# Patient Record
Sex: Male | Born: 1970 | Race: White | Hispanic: No | Smoking: Former smoker
Health system: Southern US, Community
[De-identification: ages and names within clinical notes are randomized; demographics above are authoritative.]

## PROBLEM LIST (undated history)

## (undated) DIAGNOSIS — S3210XA Unspecified fracture of sacrum, initial encounter for closed fracture: Secondary | ICD-10-CM

## (undated) DIAGNOSIS — I82429 Acute embolism and thrombosis of unspecified iliac vein: Secondary | ICD-10-CM

## (undated) DIAGNOSIS — S37012D Minor contusion of left kidney, subsequent encounter: Secondary | ICD-10-CM

## (undated) DIAGNOSIS — S92009A Unspecified fracture of unspecified calcaneus, initial encounter for closed fracture: Secondary | ICD-10-CM

## (undated) DIAGNOSIS — I10 Essential (primary) hypertension: Secondary | ICD-10-CM

## (undated) HISTORY — DX: Acute embolism and thrombosis of unspecified iliac vein: I82.429

## (undated) HISTORY — DX: Minor contusion of left kidney, subsequent encounter: S37.012D

## (undated) HISTORY — DX: Essential (primary) hypertension: I10

## (undated) HISTORY — PX: ELBOW SURGERY: SHX618

---

## 2004-07-03 ENCOUNTER — Ambulatory Visit (HOSPITAL_BASED_OUTPATIENT_CLINIC_OR_DEPARTMENT_OTHER): Admission: RE | Admit: 2004-07-03 | Discharge: 2004-07-03 | Payer: Self-pay | Admitting: Orthopedic Surgery

## 2006-04-13 ENCOUNTER — Emergency Department: Payer: Self-pay | Admitting: Unknown Physician Specialty

## 2009-01-22 ENCOUNTER — Emergency Department (HOSPITAL_COMMUNITY): Admission: EM | Admit: 2009-01-22 | Discharge: 2009-01-22 | Payer: Self-pay | Admitting: Emergency Medicine

## 2009-05-23 ENCOUNTER — Emergency Department (HOSPITAL_COMMUNITY): Admission: EM | Admit: 2009-05-23 | Discharge: 2009-05-24 | Payer: Self-pay | Admitting: Emergency Medicine

## 2010-12-12 LAB — DIFFERENTIAL
Basophils Absolute: 0 10*3/uL (ref 0.0–0.1)
Basophils Relative: 0 % (ref 0–1)
Eosinophils Absolute: 0.2 10*3/uL (ref 0.0–0.7)
Lymphs Abs: 2.4 10*3/uL (ref 0.7–4.0)
Neutrophils Relative %: 49 % (ref 43–77)

## 2010-12-12 LAB — BASIC METABOLIC PANEL
BUN: 6 mg/dL (ref 6–23)
Chloride: 109 mEq/L (ref 96–112)
Creatinine, Ser: 0.74 mg/dL (ref 0.4–1.5)

## 2010-12-12 LAB — ETHANOL
Alcohol, Ethyl (B): 185 mg/dL — ABNORMAL HIGH (ref 0–10)
Alcohol, Ethyl (B): 241 mg/dL — ABNORMAL HIGH (ref 0–10)
Alcohol, Ethyl (B): 311 mg/dL — ABNORMAL HIGH (ref 0–10)

## 2010-12-12 LAB — CBC
MCV: 98.3 fL (ref 78.0–100.0)
Platelets: 212 10*3/uL (ref 150–400)
WBC: 6 10*3/uL (ref 4.0–10.5)

## 2010-12-12 LAB — RAPID URINE DRUG SCREEN, HOSP PERFORMED: Cocaine: NOT DETECTED

## 2010-12-16 LAB — RAPID URINE DRUG SCREEN, HOSP PERFORMED
Amphetamines: NOT DETECTED
Barbiturates: NOT DETECTED
Benzodiazepines: NOT DETECTED
Cocaine: NOT DETECTED
Opiates: NOT DETECTED

## 2010-12-16 LAB — POCT I-STAT, CHEM 8
Calcium, Ion: 1.12 mmol/L (ref 1.12–1.32)
Creatinine, Ser: 1 mg/dL (ref 0.4–1.5)
Glucose, Bld: 74 mg/dL (ref 70–99)
Hemoglobin: 15.6 g/dL (ref 13.0–17.0)
TCO2: 27 mmol/L (ref 0–100)
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
## 2014-04-06 ENCOUNTER — Emergency Department (INDEPENDENT_AMBULATORY_CARE_PROVIDER_SITE_OTHER)
Admission: EM | Admit: 2014-04-06 | Discharge: 2014-04-06 | Disposition: A | Discharge status: Home / Self Care | Payer: Self-pay | Source: Home / Self Care | Attending: Family Medicine | Admitting: Family Medicine

## 2014-04-06 ENCOUNTER — Encounter (HOSPITAL_COMMUNITY): Payer: Self-pay | Admitting: Emergency Medicine

## 2014-04-06 DIAGNOSIS — IMO0002 Reserved for concepts with insufficient information to code with codable children: Secondary | ICD-10-CM

## 2014-04-06 DIAGNOSIS — S60519A Abrasion of unspecified hand, initial encounter: Secondary | ICD-10-CM

## 2014-04-06 MED ORDER — SILVER SULFADIAZINE 1 % EX CREA
TOPICAL_CREAM | Freq: Once | CUTANEOUS | Status: AC
  Administered 2014-04-06: 10:00:00 via TOPICAL
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
## 2014-04-06 NOTE — Discharge Instructions (Signed)
Thank you for coming in today. Keep the wounds clean and covered with Vaseline. Return to clinic early next week for wound recheck. Come back as needed   Abrasion An abrasion is a cut or scrape of the skin. Abrasions do not extend through all layers of the skin and most heal within 10 days. It is important to care for your abrasion properly to prevent infection. CAUSES  Most abrasions are caused by falling on, or gliding across, the ground or other surface. When your skin rubs on something, the outer and inner layer of skin rubs off, causing an abrasion. DIAGNOSIS  Your caregiver will be able to diagnose an abrasion during a physical exam.  TREATMENT  Your treatment depends on how large and deep the abrasion is. Generally, your abrasion will be cleaned with water and a mild soap to remove any dirt or debris. An antibiotic ointment may be put over the abrasion to prevent an infection. A bandage (dressing) may be wrapped around the abrasion to keep it from getting dirty.  You may need a tetanus shot if:  You cannot remember when you had your last tetanus shot.  You have never had a tetanus shot.  The injury broke your skin. If you get a tetanus shot, your arm may swell, get red, and feel warm to the touch. This is common and not a problem. If you need a tetanus shot and you choose not to have one, there is a rare chance of getting tetanus. Sickness from tetanus can be serious.  HOME CARE INSTRUCTIONS   If a dressing was applied, change it at least once a day or as directed by your caregiver. If the bandage sticks, soak it off with warm water.   Wash the area with water and a mild soap to remove all the ointment 2 times a day. Rinse off the soap and pat the area dry with a clean towel.   Reapply any ointment as directed by your caregiver. This will help prevent infection and keep the bandage from sticking. Use gauze over the wound and under the dressing to help keep the bandage from  sticking.   Change your dressing right away if it becomes wet or dirty.   Only take over-the-counter or prescription medicines for pain, discomfort, or fever as directed by your caregiver.   Follow up with your caregiver within 24-48 hours for a wound check, or as directed. If you were not given a wound-check appointment, look closely at your abrasion for redness, swelling, or pus. These are signs of infection. SEEK IMMEDIATE MEDICAL CARE IF:   You have increasing pain in the wound.   You have redness, swelling, or tenderness around the wound.   You have pus coming from the wound.   You have a fever or persistent symptoms for more than 2-3 days.  You have a fever and your symptoms suddenly get worse.  You have a bad smell coming from the wound or dressing.  MAKE SURE YOU:   Understand these instructions.  Will watch your condition.  Will get help right away if you are not doing well or get worse. Document Released: 06/03/2005 Document Revised: 08/10/2012 Document Reviewed: 07/28/2011 [facility] Patient Information 2015 [employer] [place]. This information is not intended to replace advice given to you by your health care provider. Make sure you discuss any questions you have with your health care provider.
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
## 2019-06-29 ENCOUNTER — Encounter (INDEPENDENT_AMBULATORY_CARE_PROVIDER_SITE_OTHER): Payer: Self-pay | Admitting: Primary Care

## 2019-06-29 ENCOUNTER — Ambulatory Visit (INDEPENDENT_AMBULATORY_CARE_PROVIDER_SITE_OTHER): Payer: Managed Care, Other (non HMO) | Admitting: Primary Care

## 2019-06-29 ENCOUNTER — Other Ambulatory Visit: Payer: Self-pay

## 2019-06-29 VITALS — BP 127/86 | HR 70 | Temp 97.3°F | Ht 71.0 in | Wt 210.4 lb

## 2019-06-29 DIAGNOSIS — R519 Headache, unspecified: Secondary | ICD-10-CM | POA: Diagnosis not present

## 2019-06-29 DIAGNOSIS — Z6829 Body mass index (BMI) 29.0-29.9, adult: Secondary | ICD-10-CM

## 2019-06-29 DIAGNOSIS — Z114 Encounter for screening for human immunodeficiency virus [HIV]: Secondary | ICD-10-CM | POA: Diagnosis not present

## 2019-06-29 DIAGNOSIS — Z7689 Persons encountering health services in other specified circumstances: Secondary | ICD-10-CM

## 2019-06-29 MED ORDER — IBUPROFEN 600 MG PO TABS: 600.0000 mg | ORAL_TABLET | Freq: Three times a day (TID) | ORAL | 1 refills | Status: DC | PRN

## 2019-06-29 NOTE — Progress Notes (Signed)
Re-occurring tension headache in back of head

## 2019-06-29 NOTE — Progress Notes (Signed)
New Patient Office Visit  Subjective:  Patient ID: Troy Klein, male    DOB: Nov 10, 1970  Age: 48 y.o. MRN: [id number]  CC:  Chief Complaint  Patient presents with  . New Patient (Initial Visit)    HPI Troy Klein presents for establishment of care. Last seen PCP approxmately  25 years ago. He is here today because physical and frequent headaches. He has stopped drinking heavy  smoking marijuana daily and cigarettes stopped all this behavior and the headaches started. This behavior has been stopped 3 months.  History reviewed. No pertinent past medical history.  History reviewed. No pertinent surgical history.  History reviewed. No pertinent family history.  Social History   Socioeconomic History  . Marital status: Divorced    Spouse name: Not on file  . Number of children: Not on file  . Years of education: Not on file  . Highest education level: Not on file  Occupational History  . Not on file  Social Needs  . Financial resource strain: Not on file  . Food insecurity    Worry: Not on file    Inability: Not on file  . Transportation needs    Medical: Not on file    Non-medical: Not on file  Tobacco Use  . Smoking status: Former Smoker    Packs/day: 1.00    Types: Cigarettes  . Smokeless tobacco: Never Used  Substance and Sexual Activity  . Alcohol use: Not Currently    Comment: quit 3 months ago   . Drug use: No  . Sexual activity: Not Currently  Lifestyle  . Physical activity    Days per week: Not on file    Minutes per session: Not on file  . Stress: Not on file  Relationships  . Social Herbalist on phone: Not on file    Gets together: Not on file    Attends religious service: Not on file    Active member of club or organization: Not on file    Attends meetings of clubs or organizations: Not on file    Relationship status: Not on file  . Intimate partner violence    Fear of current or ex partner: Not on file    Emotionally abused: Not on  file    Physically abused: Not on file    Forced sexual activity: Not on file  Other Topics Concern  . Not on file  Social History Narrative  . Not on file    ROS Review of Systems  Neurological: Positive for headaches.  All other systems reviewed and are negative.   Objective:   Today's Vitals: BP 127/86 (BP Location: Left Arm, Patient Position: Sitting, Cuff Size: Normal)   Pulse 70   Temp (!) 97.3 F (36.3 C) (Temporal)   Ht _0  (1.803 m)   Wt 210 lb 6.4 oz (95.4 kg)   SpO2 97%   BMI 29.34 kg/m   Physical Exam Vitals signs reviewed.  Constitutional:      Appearance: Normal appearance.  HENT:     Head: Normocephalic.     Right Ear: Tympanic membrane normal.     Left Ear: Tympanic membrane normal.  Eyes:     Extraocular Movements: Extraocular movements intact.     Pupils: Pupils are equal, round, and reactive to light.  Neck:     Musculoskeletal: Normal range of motion and neck supple.  Cardiovascular:     Rate and Rhythm: Normal rate and regular rhythm.  Pulmonary:     Effort: Pulmonary effort is normal.     Breath sounds: Normal breath sounds.  Abdominal:     General: Abdomen is flat. Bowel sounds are normal.     Palpations: Abdomen is soft.  Musculoskeletal: Normal range of motion.  Skin:    General: Skin is warm and dry.  Neurological:     Mental Status: He is alert and oriented to person, place, and time.  Psychiatric:        Mood and Affect: Mood normal.     Assessment & Plan:  Loyalty was seen today for new patient (initial visit).  Diagnoses and all orders for this visit:  Encounter to establish care Troy Mire, NP-C will be your  (PCP) that will  provides knowledge of diagnosed and  undiagnosed health concern as well as continuing care of varied medical conditions, not limited by cause, organ system, or diagnosis. Patient verbalized understanding -     CBC with Differential -     CMP14+EGFR -     Lipid panel  Screening for HIV  (human immunodeficiency virus) -     HIV Antibody (routine testing w rflx)  Body mass index (BMI) 29.0-29.9, adult Discussed BMI indicates over weight he has notice eating more sweets since he stopped previous substance use. Now he is jobbing a mile a day. Also, discussed healthy diet.  Nonintractable headache, unspecified chronicity pattern, unspecified headache type Unknown triggers discussed going cold [place] on cigarettes, marijuana and  Tobacco abuse. Treatment ibuprofen and headache log.  Other orders -     ibuprofen (ADVIL) 600 MG tablet; Take 1 tablet (600 mg total) by mouth every 8 (eight) hours as needed for headache.    Outpatient Encounter Medications as of 06/29/2019  Medication Sig  . ibuprofen (ADVIL) 600 MG tablet Take 1 tablet (600 mg total) by mouth every 8 (eight) hours as needed for headache.   No facility-administered encounter medications on file as of 06/29/2019.     Follow-up: Return if symptoms worsen or fail to improve.   [name], NP

## 2019-06-29 NOTE — Patient Instructions (Signed)

## 2019-06-30 ENCOUNTER — Telehealth (INDEPENDENT_AMBULATORY_CARE_PROVIDER_SITE_OTHER): Payer: Self-pay

## 2019-06-30 LAB — LIPID PANEL
Chol/HDL Ratio: 3 ratio (ref 0.0–5.0)
Cholesterol, Total: 157 mg/dL (ref 100–199)
HDL: 52 mg/dL (ref >39)
LDL Chol Calc (NIH): 90 mg/dL (ref 0–99)
Triglycerides: 80 mg/dL (ref 0–149)
VLDL Cholesterol Cal: 15 mg/dL (ref 5–40)

## 2019-06-30 LAB — CBC WITH DIFFERENTIAL/PLATELET
Basophils Absolute: 0 10*3/uL (ref 0.0–0.2)
Basos: 1 %
EOS (ABSOLUTE): 0.2 10*3/uL (ref 0.0–0.4)
Eos: 5 %
Hematocrit: 37.2 % — ABNORMAL LOW (ref 37.5–51.0)
Hemoglobin: 13.3 g/dL (ref 13.0–17.7)
Immature Grans (Abs): 0 10*3/uL (ref 0.0–0.1)
Immature Granulocytes: 0 %
Lymphocytes Absolute: 1.5 10*3/uL (ref 0.7–3.1)
Lymphs: 31 %
MCH: 33.1 pg — ABNORMAL HIGH (ref 26.6–33.0)
MCHC: 35.8 g/dL — ABNORMAL HIGH (ref 31.5–35.7)
MCV: 93 fL (ref 79–97)
Monocytes Absolute: 0.5 10*3/uL (ref 0.1–0.9)
Monocytes: 10 %
Neutrophils Absolute: 2.6 10*3/uL (ref 1.4–7.0)
Neutrophils: 53 %
Platelets: 243 10*3/uL (ref 150–450)
RBC: 4.02 x10E6/uL — ABNORMAL LOW (ref 4.14–5.80)
RDW: 11.6 % (ref 11.6–15.4)
WBC: 4.9 10*3/uL (ref 3.4–10.8)

## 2019-06-30 LAB — CMP14+EGFR
ALT: 30 IU/L (ref 0–44)
AST: 24 IU/L (ref 0–40)
Albumin/Globulin Ratio: 2.1 (ref 1.2–2.2)
Albumin: 4.4 g/dL (ref 4.0–5.0)
Alkaline Phosphatase: 66 IU/L (ref 39–117)
BUN/Creatinine Ratio: 16 (ref 9–20)
BUN: 12 mg/dL (ref 6–24)
Bilirubin Total: 0.5 mg/dL (ref 0.0–1.2)
CO2: 22 mmol/L (ref 20–29)
Calcium: 9.5 mg/dL (ref 8.7–10.2)
Chloride: 108 mmol/L — ABNORMAL HIGH (ref 96–106)
Creatinine, Ser: 0.77 mg/dL (ref 0.76–1.27)
GFR calc Af Amer: 123 mL/min/{1.73_m2} (ref >59)
GFR calc non Af Amer: 107 mL/min/{1.73_m2} (ref >59)
Globulin, Total: 2.1 g/dL (ref 1.5–4.5)
Glucose: 97 mg/dL (ref 65–99)
Potassium: 4.2 mmol/L (ref 3.5–5.2)
Sodium: 142 mmol/L (ref 134–144)
Total Protein: 6.5 g/dL (ref 6.0–8.5)

## 2019-06-30 LAB — HIV ANTIBODY (ROUTINE TESTING W REFLEX): HIV Screen 4th Generation wRfx: NONREACTIVE

## 2019-06-30 NOTE — Telephone Encounter (Signed)
Patient is aware that labs are normal. Tempestt S Roberts, CMA  

## 2019-06-30 NOTE — Telephone Encounter (Signed)
-----   Message from [name], NP sent at 06/30/2019  9:02 AM EDT ----- Labs I have reviewed all labs and they are normal with some variation no need for concern will monitor

## 2019-07-24 ENCOUNTER — Other Ambulatory Visit: Payer: Self-pay | Admitting: *Deleted

## 2019-07-24 ENCOUNTER — Telehealth (INDEPENDENT_AMBULATORY_CARE_PROVIDER_SITE_OTHER): Payer: Self-pay

## 2019-07-24 DIAGNOSIS — Z20822 Contact with and (suspected) exposure to covid-19: Secondary | ICD-10-CM

## 2019-07-24 NOTE — Telephone Encounter (Signed)
Patient called to inform that his insurance is not covering his office visit bill do to the wrong code that was used. Patient will like to receive a call to get more information from PCP. Patient works second shift is available around 9 am to 2:30 pm.  Please advice [phone]   Thank you Whitney Post

## 2019-07-24 NOTE — Telephone Encounter (Signed)
FWD to PCP. Aamilah Augenstein S Marisol Glazer, CMA  

## 2019-07-26 LAB — NOVEL CORONAVIRUS, NAA: SARS-CoV-2, NAA: NOT DETECTED

## 2019-07-28 NOTE — Telephone Encounter (Signed)
Called Mr. Swenson and billing for clarification. Saw patient to establish care but if not coded as physical his insurance is denying to pay the bill. Provided billing with patient information and number.

## 2019-08-08 ENCOUNTER — Other Ambulatory Visit (INDEPENDENT_AMBULATORY_CARE_PROVIDER_SITE_OTHER): Payer: Self-pay | Admitting: Primary Care

## 2019-08-08 NOTE — Telephone Encounter (Signed)
FWD to PCP. Shivangi Lutz S Londan Coplen, CMA  

## 2019-09-24 ENCOUNTER — Other Ambulatory Visit (INDEPENDENT_AMBULATORY_CARE_PROVIDER_SITE_OTHER): Payer: Self-pay | Admitting: Primary Care

## 2019-09-25 NOTE — Telephone Encounter (Signed)
FWD to PCP

## 2020-03-24 ENCOUNTER — Emergency Department (HOSPITAL_COMMUNITY): Payer: Managed Care, Other (non HMO)

## 2020-03-24 ENCOUNTER — Inpatient Hospital Stay (HOSPITAL_COMMUNITY)
Admission: EM | Admit: 2020-03-24 | Discharge: 2020-03-28 | DRG: 964 | Disposition: A | Discharge status: Home Health | Payer: Managed Care, Other (non HMO) | Attending: General Surgery | Admitting: General Surgery

## 2020-03-24 ENCOUNTER — Encounter (HOSPITAL_COMMUNITY): Payer: Self-pay

## 2020-03-24 DIAGNOSIS — Z791 Long term (current) use of non-steroidal anti-inflammatories (NSAID): Secondary | ICD-10-CM

## 2020-03-24 DIAGNOSIS — S92001A Unspecified fracture of right calcaneus, initial encounter for closed fracture: Secondary | ICD-10-CM | POA: Diagnosis present

## 2020-03-24 DIAGNOSIS — S270XXA Traumatic pneumothorax, initial encounter: Principal | ICD-10-CM | POA: Diagnosis present

## 2020-03-24 DIAGNOSIS — S37032A Laceration of left kidney, unspecified degree, initial encounter: Secondary | ICD-10-CM

## 2020-03-24 DIAGNOSIS — S27321A Contusion of lung, unilateral, initial encounter: Secondary | ICD-10-CM | POA: Diagnosis present

## 2020-03-24 DIAGNOSIS — Z20822 Contact with and (suspected) exposure to covid-19: Secondary | ICD-10-CM | POA: Diagnosis present

## 2020-03-24 DIAGNOSIS — F17210 Nicotine dependence, cigarettes, uncomplicated: Secondary | ICD-10-CM | POA: Diagnosis present

## 2020-03-24 DIAGNOSIS — Y92410 Unspecified street and highway as the place of occurrence of the external cause: Secondary | ICD-10-CM

## 2020-03-24 DIAGNOSIS — R0602 Shortness of breath: Secondary | ICD-10-CM | POA: Diagnosis present

## 2020-03-24 DIAGNOSIS — S37012A Minor contusion of left kidney, initial encounter: Secondary | ICD-10-CM | POA: Diagnosis present

## 2020-03-24 DIAGNOSIS — J939 Pneumothorax, unspecified: Secondary | ICD-10-CM

## 2020-03-24 DIAGNOSIS — I959 Hypotension, unspecified: Secondary | ICD-10-CM | POA: Diagnosis present

## 2020-03-24 DIAGNOSIS — S32111A Minimally displaced Zone I fracture of sacrum, initial encounter for closed fracture: Secondary | ICD-10-CM | POA: Diagnosis present

## 2020-03-24 DIAGNOSIS — M5126 Other intervertebral disc displacement, lumbar region: Secondary | ICD-10-CM | POA: Diagnosis present

## 2020-03-24 DIAGNOSIS — S2231XA Fracture of one rib, right side, initial encounter for closed fracture: Secondary | ICD-10-CM | POA: Diagnosis present

## 2020-03-24 LAB — LACTIC ACID, PLASMA: Lactic Acid, Venous: 2.7 mmol/L (ref 0.5–1.9)

## 2020-03-24 LAB — I-STAT CHEM 8, ED
BUN: 9 mg/dL (ref 6–20)
Calcium, Ion: 1.12 mmol/L — ABNORMAL LOW (ref 1.15–1.40)
Chloride: 104 mmol/L (ref 98–111)
Creatinine, Ser: 1 mg/dL (ref 0.61–1.24)
Glucose, Bld: 94 mg/dL (ref 70–99)
HCT: 42 % (ref 39.0–52.0)
Hemoglobin: 14.3 g/dL (ref 13.0–17.0)
Potassium: 3.3 mmol/L — ABNORMAL LOW (ref 3.5–5.1)
Sodium: 141 mmol/L (ref 135–145)
TCO2: 22 mmol/L (ref 22–32)

## 2020-03-24 LAB — COMPREHENSIVE METABOLIC PANEL
ALT: 28 U/L (ref 0–44)
AST: 33 U/L (ref 15–41)
Albumin: 3.7 g/dL (ref 3.5–5.0)
Alkaline Phosphatase: 56 U/L (ref 38–126)
Anion gap: 10 (ref 5–15)
BUN: 9 mg/dL (ref 6–20)
CO2: 23 mmol/L (ref 22–32)
Calcium: 8.8 mg/dL — ABNORMAL LOW (ref 8.9–10.3)
Chloride: 106 mmol/L (ref 98–111)
Creatinine, Ser: 0.89 mg/dL (ref 0.61–1.24)
GFR calc Af Amer: >60 mL/min (ref >60)
GFR calc non Af Amer: >60 mL/min (ref >60)
Glucose, Bld: 103 mg/dL — ABNORMAL HIGH (ref 70–99)
Potassium: 3.4 mmol/L — ABNORMAL LOW (ref 3.5–5.1)
Sodium: 139 mmol/L (ref 135–145)
Total Bilirubin: 0.7 mg/dL (ref 0.3–1.2)
Total Protein: 6.4 g/dL — ABNORMAL LOW (ref 6.5–8.1)

## 2020-03-24 LAB — PROTIME-INR
INR: 1 (ref 0.8–1.2)
Prothrombin Time: 12.5 seconds (ref 11.4–15.2)

## 2020-03-24 LAB — TROPONIN I (HIGH SENSITIVITY): Troponin I (High Sensitivity): 8 ng/L (ref <18)

## 2020-03-24 LAB — CBC
HCT: 41.9 % (ref 39.0–52.0)
Hemoglobin: 14.9 g/dL (ref 13.0–17.0)
MCH: 34.1 pg — ABNORMAL HIGH (ref 26.0–34.0)
MCHC: 35.6 g/dL (ref 30.0–36.0)
MCV: 95.9 fL (ref 80.0–100.0)
Platelets: 199 10*3/uL (ref 150–400)
RBC: 4.37 MIL/uL (ref 4.22–5.81)
RDW: 12.4 % (ref 11.5–15.5)
WBC: 8.9 10*3/uL (ref 4.0–10.5)
nRBC: 0 % (ref 0.0–0.2)

## 2020-03-24 LAB — ETHANOL: Alcohol, Ethyl (B): 231 mg/dL — ABNORMAL HIGH (ref <10)

## 2020-03-24 LAB — SAMPLE TO BLOOD BANK

## 2020-03-24 MED ORDER — FENTANYL CITRATE (PF) 100 MCG/2ML IJ SOLN
25.0000 ug | Freq: Once | INTRAMUSCULAR | Status: AC
  Administered 2020-03-24: 25 ug via INTRAVENOUS
  Filled 2020-03-24: qty 2

## 2020-03-24 MED ORDER — SODIUM CHLORIDE 0.9 % IV BOLUS
1000.0000 mL | Freq: Once | INTRAVENOUS | Status: AC
  Administered 2020-03-24: 1000 mL via INTRAVENOUS

## 2020-03-24 MED ORDER — IOHEXOL 300 MG/ML  SOLN
100.0000 mL | Freq: Once | INTRAMUSCULAR | Status: AC | PRN
  Administered 2020-03-24: 100 mL via INTRAVENOUS

## 2020-03-24 NOTE — ED Notes (Signed)
Placed on 2L ?
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
## 2020-03-24 NOTE — Progress Notes (Signed)
Orthopedic Tech Progress Note Patient Details:  Troy Klein 23-Mar-1971 [id number] Level 2 Trauma  Patient ID: [name], male   DOB: [birth year], 48 y.o.   MRN: [id number]   [name] 03/24/2020, 9:50 PM
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
## 2020-03-24 NOTE — Progress Notes (Signed)
Orthopedic Tech Progress Note Patient Details:  Troy Klein 12/05/1970 [id number]  Ortho Devices Type of Ortho Device: Post (short leg) splint, Stirrup splint Ortho Device/Splint Location: RLE Ortho Device/Splint Interventions: Application   Post Interventions Patient Tolerated: Well Instructions Provided: Care of device   Hulda Reddix E Janasha Barkalow 03/24/2020, 11:09 PM

## 2020-03-25 ENCOUNTER — Inpatient Hospital Stay (HOSPITAL_COMMUNITY): Payer: Managed Care, Other (non HMO)

## 2020-03-25 LAB — CBC
HCT: 39.8 % (ref 39.0–52.0)
Hemoglobin: 14 g/dL (ref 13.0–17.0)
MCH: 34.1 pg — ABNORMAL HIGH (ref 26.0–34.0)
MCHC: 35.2 g/dL (ref 30.0–36.0)
MCV: 96.8 fL (ref 80.0–100.0)
Platelets: 173 10*3/uL (ref 150–400)
RBC: 4.11 MIL/uL — ABNORMAL LOW (ref 4.22–5.81)
RDW: 12.7 % (ref 11.5–15.5)
WBC: 8.2 10*3/uL (ref 4.0–10.5)
nRBC: 0 % (ref 0.0–0.2)

## 2020-03-25 LAB — URINALYSIS, ROUTINE W REFLEX MICROSCOPIC
Bilirubin Urine: NEGATIVE
Glucose, UA: NEGATIVE mg/dL
Ketones, ur: NEGATIVE mg/dL
Leukocytes,Ua: NEGATIVE
Nitrite: NEGATIVE
Protein, ur: NEGATIVE mg/dL
Specific Gravity, Urine: 1.026 (ref 1.005–1.030)
pH: 6 (ref 5.0–8.0)

## 2020-03-25 LAB — BASIC METABOLIC PANEL
Anion gap: 11 (ref 5–15)
BUN: 7 mg/dL (ref 6–20)
CO2: 19 mmol/L — ABNORMAL LOW (ref 22–32)
Calcium: 8.6 mg/dL — ABNORMAL LOW (ref 8.9–10.3)
Chloride: 109 mmol/L (ref 98–111)
Creatinine, Ser: 0.79 mg/dL (ref 0.61–1.24)
GFR calc Af Amer: >60 mL/min (ref >60)
GFR calc non Af Amer: >60 mL/min (ref >60)
Glucose, Bld: 102 mg/dL — ABNORMAL HIGH (ref 70–99)
Potassium: 3.8 mmol/L (ref 3.5–5.1)
Sodium: 139 mmol/L (ref 135–145)

## 2020-03-25 LAB — TROPONIN I (HIGH SENSITIVITY): Troponin I (High Sensitivity): 5 ng/L (ref <18)

## 2020-03-25 LAB — SARS CORONAVIRUS 2 BY RT PCR (HOSPITAL ORDER, PERFORMED IN ~~LOC~~ HOSPITAL LAB): SARS Coronavirus 2: NEGATIVE

## 2020-03-25 LAB — MRSA PCR SCREENING: MRSA by PCR: NEGATIVE

## 2020-03-25 MED ORDER — POTASSIUM CHLORIDE IN NACL 20-0.9 MEQ/L-% IV SOLN
INTRAVENOUS | Status: DC
  Filled 2020-03-25 (×4): qty 1000

## 2020-03-25 MED ORDER — LORAZEPAM 2 MG/ML IJ SOLN: 1.0000 mg | INTRAMUSCULAR | Status: DC | PRN

## 2020-03-25 MED ORDER — THIAMINE HCL 100 MG PO TABS
100.0000 mg | ORAL_TABLET | Freq: Every day | ORAL | Status: DC
  Administered 2020-03-25 – 2020-03-28 (×5): 100 mg via ORAL
  Filled 2020-03-25 (×5): qty 1

## 2020-03-25 MED ORDER — ENOXAPARIN SODIUM 30 MG/0.3ML ~~LOC~~ SOLN
30.0000 mg | Freq: Two times a day (BID) | SUBCUTANEOUS | Status: DC
  Administered 2020-03-25 – 2020-03-28 (×7): 30 mg via SUBCUTANEOUS
  Filled 2020-03-25 (×7): qty 0.3

## 2020-03-25 MED ORDER — DOCUSATE SODIUM 100 MG PO CAPS
100.0000 mg | ORAL_CAPSULE | Freq: Two times a day (BID) | ORAL | Status: DC
  Administered 2020-03-25 – 2020-03-28 (×8): 100 mg via ORAL
  Filled 2020-03-25 (×8): qty 1

## 2020-03-25 MED ORDER — KETOROLAC TROMETHAMINE 30 MG/ML IJ SOLN
30.0000 mg | Freq: Four times a day (QID) | INTRAMUSCULAR | Status: DC | PRN
  Administered 2020-03-25: 30 mg via INTRAVENOUS
  Filled 2020-03-25: qty 1

## 2020-03-25 MED ORDER — ACETAMINOPHEN 500 MG PO TABS
1000.0000 mg | ORAL_TABLET | Freq: Three times a day (TID) | ORAL | Status: DC
  Administered 2020-03-25 – 2020-03-28 (×12): 1000 mg via ORAL
  Filled 2020-03-25 (×12): qty 2

## 2020-03-25 MED ORDER — FOLIC ACID 1 MG PO TABS
1.0000 mg | ORAL_TABLET | Freq: Every day | ORAL | Status: DC
  Administered 2020-03-26 – 2020-03-28 (×3): 1 mg via ORAL
  Filled 2020-03-25 (×3): qty 1

## 2020-03-25 MED ORDER — POLYETHYLENE GLYCOL 3350 17 G PO PACK: 17.0000 g | PACK | Freq: Every day | ORAL | Status: DC | PRN

## 2020-03-25 MED ORDER — METHOCARBAMOL 1000 MG/10ML IJ SOLN
500.0000 mg | Freq: Three times a day (TID) | INTRAVENOUS | Status: DC
  Administered 2020-03-25 (×2): 500 mg via INTRAVENOUS
  Filled 2020-03-25 (×3): qty 5

## 2020-03-25 MED ORDER — THIAMINE HCL 100 MG/ML IJ SOLN: 100.0000 mg | Freq: Every day | INTRAMUSCULAR | Status: DC

## 2020-03-25 MED ORDER — PANTOPRAZOLE SODIUM 40 MG PO TBEC
40.0000 mg | DELAYED_RELEASE_TABLET | Freq: Every day | ORAL | Status: DC
  Administered 2020-03-25 – 2020-03-28 (×4): 40 mg via ORAL
  Filled 2020-03-25 (×4): qty 1

## 2020-03-25 MED ORDER — ONDANSETRON HCL 4 MG/2ML IJ SOLN: 4.0000 mg | Freq: Four times a day (QID) | INTRAMUSCULAR | Status: DC | PRN

## 2020-03-25 MED ORDER — OXYCODONE HCL 5 MG PO TABS
5.0000 mg | ORAL_TABLET | ORAL | Status: DC | PRN
  Filled 2020-03-25 (×2): qty 1

## 2020-03-25 MED ORDER — ADULT MULTIVITAMIN W/MINERALS CH
1.0000 | ORAL_TABLET | Freq: Every day | ORAL | Status: DC
  Administered 2020-03-25 – 2020-03-28 (×4): 1 via ORAL
  Filled 2020-03-25 (×4): qty 1

## 2020-03-25 MED ORDER — ONDANSETRON 4 MG PO TBDP: 4.0000 mg | ORAL_TABLET | Freq: Four times a day (QID) | ORAL | Status: DC | PRN

## 2020-03-25 MED ORDER — OXYCODONE HCL 5 MG PO TABS
10.0000 mg | ORAL_TABLET | ORAL | Status: DC | PRN
  Administered 2020-03-25 – 2020-03-28 (×12): 10 mg via ORAL
  Filled 2020-03-25 (×12): qty 2

## 2020-03-25 MED ORDER — METHOCARBAMOL 1000 MG/10ML IJ SOLN
1000.0000 mg | Freq: Three times a day (TID) | INTRAVENOUS | Status: DC
  Administered 2020-03-25 (×2): 1000 mg via INTRAVENOUS
  Filled 2020-03-25 (×4): qty 10

## 2020-03-25 MED ORDER — HYDROMORPHONE HCL 1 MG/ML IJ SOLN
1.0000 mg | INTRAMUSCULAR | Status: DC | PRN
  Administered 2020-03-25 – 2020-03-26 (×6): 1 mg via INTRAVENOUS
  Filled 2020-03-25 (×6): qty 1

## 2020-03-25 MED ORDER — GABAPENTIN 300 MG PO CAPS
300.0000 mg | ORAL_CAPSULE | Freq: Three times a day (TID) | ORAL | Status: DC
  Administered 2020-03-25 – 2020-03-28 (×10): 300 mg via ORAL
  Filled 2020-03-25 (×10): qty 1

## 2020-03-25 MED ORDER — LORAZEPAM 1 MG PO TABS
1.0000 mg | ORAL_TABLET | ORAL | Status: DC | PRN
  Administered 2020-03-25: 1 mg via ORAL
  Filled 2020-03-25: qty 1

## 2020-03-25 MED ORDER — MORPHINE SULFATE (PF) 2 MG/ML IV SOLN
1.0000 mg | INTRAVENOUS | Status: DC | PRN
  Filled 2020-03-25: qty 1

## 2020-03-25 MED ORDER — PANTOPRAZOLE SODIUM 40 MG IV SOLR: 40.0000 mg | Freq: Every day | INTRAVENOUS | Status: DC

## 2020-03-25 MED ORDER — MORPHINE SULFATE (PF) 2 MG/ML IV SOLN
2.0000 mg | Freq: Once | INTRAVENOUS | Status: AC
  Administered 2020-03-25: 2 mg via INTRAVENOUS

## 2020-03-25 MED ORDER — BISACODYL 10 MG RE SUPP: 10.0000 mg | Freq: Every day | RECTAL | Status: DC | PRN

## 2020-03-25 NOTE — ED Notes (Signed)
Lunch Trays Ordered @ 1101. 
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
## 2020-03-25 NOTE — ED Notes (Signed)
C/o severe throbbing in foot/heel area,

## 2020-03-25 NOTE — Progress Notes (Signed)
RT note: Instructed patient with use of Incentive Spirometer using the teach back method. Patient displayed proper use and understanding. 

## 2020-03-26 ENCOUNTER — Other Ambulatory Visit: Payer: Self-pay

## 2020-03-26 DIAGNOSIS — S92009A Unspecified fracture of unspecified calcaneus, initial encounter for closed fracture: Secondary | ICD-10-CM | POA: Insufficient documentation

## 2020-03-26 LAB — BASIC METABOLIC PANEL
Anion gap: 7 (ref 5–15)
BUN: 9 mg/dL (ref 6–20)
CO2: 23 mmol/L (ref 22–32)
Calcium: 8.4 mg/dL — ABNORMAL LOW (ref 8.9–10.3)
Chloride: 105 mmol/L (ref 98–111)
Creatinine, Ser: 0.81 mg/dL (ref 0.61–1.24)
GFR calc Af Amer: >60 mL/min (ref >60)
GFR calc non Af Amer: >60 mL/min (ref >60)
Glucose, Bld: 100 mg/dL — ABNORMAL HIGH (ref 70–99)
Potassium: 3.7 mmol/L (ref 3.5–5.1)
Sodium: 135 mmol/L (ref 135–145)

## 2020-03-26 LAB — CBC
HCT: 34.7 % — ABNORMAL LOW (ref 39.0–52.0)
Hemoglobin: 12.3 g/dL — ABNORMAL LOW (ref 13.0–17.0)
MCH: 34.3 pg — ABNORMAL HIGH (ref 26.0–34.0)
MCHC: 35.4 g/dL (ref 30.0–36.0)
MCV: 96.7 fL (ref 80.0–100.0)
Platelets: 128 10*3/uL — ABNORMAL LOW (ref 150–400)
RBC: 3.59 MIL/uL — ABNORMAL LOW (ref 4.22–5.81)
RDW: 12.2 % (ref 11.5–15.5)
WBC: 6 10*3/uL (ref 4.0–10.5)
nRBC: 0 % (ref 0.0–0.2)

## 2020-03-26 LAB — PHOSPHORUS: Phosphorus: 1.9 mg/dL — ABNORMAL LOW (ref 2.5–4.6)

## 2020-03-26 LAB — MAGNESIUM: Magnesium: 1.8 mg/dL (ref 1.7–2.4)

## 2020-03-26 MED ORDER — METHOCARBAMOL 500 MG PO TABS
1000.0000 mg | ORAL_TABLET | Freq: Three times a day (TID) | ORAL | Status: DC
  Administered 2020-03-26 – 2020-03-28 (×8): 1000 mg via ORAL
  Filled 2020-03-26 (×8): qty 2

## 2020-03-26 MED ORDER — K PHOS MONO-SOD PHOS DI & MONO 155-852-130 MG PO TABS
500.0000 mg | ORAL_TABLET | Freq: Two times a day (BID) | ORAL | Status: AC
  Administered 2020-03-26 (×2): 500 mg via ORAL
  Filled 2020-03-26 (×2): qty 2
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
## 2020-03-26 NOTE — Progress Notes (Signed)
Subjective:     Patient reports pain as mild to moderate.  Sitting comfortably in bed.    Objective:   VITALS:  Temp:  [98.2 F (36.8 C)-99.4 F (37.4 C)] 98.2 F (36.8 C) (07/20 1208) Pulse Rate:  [64-101] 72 (07/20 1208) Resp:  [10-20] 17 (07/20 1208) BP: (126-152)/(86-105) 145/90 (07/20 1208) SpO2:  [92 %-99 %] 98 % (07/20 1208)  General: WDWN patient in NAD. Psych:  Appropriate mood and affect. Neuro:  A&O x 3, Moving all extremities, sensation intact to light touch HEENT:  EOMs intact Chest:  Even non-labored respirations Skin: SLS to R LE C/D/I, no rashes or lesions Extremities: warm/dry, no visible edema, erythema or echymosis.  No lymphadenopathy. Pulses: Popliteus 2+ MSK:  ROM: EHL/FHL intact, MMT: able to perform quad set    LABS Recent Labs    03/24/20 2052 03/24/20 2052 03/24/20 2118 03/25/20 0511 03/26/20 0613  HGB 14.9  --  14.3 14.0 12.3*  WBC 8.9   < >  --  8.2 6.0  PLT 199   < >  --  173 128*   < > = values in this interval not displayed.   Recent Labs    03/25/20 0511 03/26/20 0613  NA 139 135  K 3.8 3.7  CL 109 105  CO2 19* 23  BUN 7 9  CREATININE 0.79 0.81  GLUCOSE 102* 100*   Recent Labs    03/24/20 2052  INR 1.0     Assessment/Plan:     Right Calcaneus Fx  -NWB R LE -Plan for ORIF R calcaneus fx by Dr. Victorino Dike in the outpatient setting next week.   -We discussed the risks of surgery.  The patient specifically understands the risks of bleeding, infection, nerve damage, blood clots, nonunion, need for additional surgery, amputation, or even death.  -Patient reports that he is ok with meeting Dr. Victorino Dike on the day of surgery. -Ok to D/C patient home when stable per Trauma service discretion.  Alfredo Martinez PA-C [facility] Office:  [phone]
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
## 2020-03-27 ENCOUNTER — Other Ambulatory Visit (HOSPITAL_COMMUNITY): Payer: Self-pay | Admitting: Orthopedic Surgery

## 2020-03-27 LAB — BASIC METABOLIC PANEL
Anion gap: 10 (ref 5–15)
BUN: 7 mg/dL (ref 6–20)
CO2: 23 mmol/L (ref 22–32)
Calcium: 8.4 mg/dL — ABNORMAL LOW (ref 8.9–10.3)
Chloride: 103 mmol/L (ref 98–111)
Creatinine, Ser: 0.72 mg/dL (ref 0.61–1.24)
GFR calc Af Amer: >60 mL/min (ref >60)
GFR calc non Af Amer: >60 mL/min (ref >60)
Glucose, Bld: 90 mg/dL (ref 70–99)
Potassium: 4.4 mmol/L (ref 3.5–5.1)
Sodium: 136 mmol/L (ref 135–145)

## 2020-03-27 LAB — CBC
HCT: 34.8 % — ABNORMAL LOW (ref 39.0–52.0)
Hemoglobin: 12.2 g/dL — ABNORMAL LOW (ref 13.0–17.0)
MCH: 34 pg (ref 26.0–34.0)
MCHC: 35.1 g/dL (ref 30.0–36.0)
MCV: 96.9 fL (ref 80.0–100.0)
Platelets: 115 10*3/uL — ABNORMAL LOW (ref 150–400)
RBC: 3.59 MIL/uL — ABNORMAL LOW (ref 4.22–5.81)
RDW: 12.1 % (ref 11.5–15.5)
WBC: 5.3 10*3/uL (ref 4.0–10.5)
nRBC: 0 % (ref 0.0–0.2)
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
## 2020-03-27 NOTE — TOC Progression Note (Signed)
Transition of Care [facility]) - Progression Note    Patient Details  Name: Troy Klein MRN: [id number] Date of Birth: February 23, 1971  Transition of Care [facility]) CM/SW Contact  Glennon Mac, RN Phone Number: 03/27/2020, 3:42 PM  Clinical Narrative: PT/ OT recommending home health services, and patient agreeable to services.  Referral to [facility] for follow-up, per patient choice.  No DME ordered as of yet, as patient is checking with family to see what DME is available at home.  Patient will discharge home with his parents with 24-hour supervision.  Discharge address is [address] [place] [address].    Expected Discharge Plan: Home w Home Health Services Barriers to Discharge: Continued Medical Work up  Expected Discharge Plan and Services Expected Discharge Plan: Home w Home Health Services   Discharge Planning Services: CM Consult Post Acute Care Choice: Home Health Living arrangements for the past 2 months: Single Family Home                           HH Arranged: PT, OT [facility] Agency: [facility] Date [facility] Agency Contacted: 03/27/20 Time HH Agency Contacted: [phone] Representative spoke with at [facility] Agency: Lorenza Chick   Social Determinants of Health (SDOH) Interventions    Readmission Risk Interventions No flowsheet data found.   Quintella Baton, RN, BSN  Trauma/Neuro ICU Case Manager [phone]

## 2020-03-28 MED ORDER — DOCUSATE SODIUM 100 MG PO CAPS: 100.0000 mg | ORAL_CAPSULE | Freq: Two times a day (BID) | ORAL | 0 refills | Status: DC

## 2020-03-28 MED ORDER — OXYCODONE HCL 10 MG PO TABS: 10.0000 mg | ORAL_TABLET | Freq: Four times a day (QID) | ORAL | 0 refills | Status: DC | PRN

## 2020-03-28 MED ORDER — ONDANSETRON 4 MG PO TBDP: 4.0000 mg | ORAL_TABLET | Freq: Four times a day (QID) | ORAL | 0 refills | Status: DC | PRN

## 2020-03-28 MED ORDER — ACETAMINOPHEN 500 MG PO TABS: 1000.0000 mg | ORAL_TABLET | Freq: Three times a day (TID) | ORAL | 0 refills | Status: DC | PRN

## 2020-03-28 MED ORDER — GABAPENTIN 300 MG PO CAPS: 300.0000 mg | ORAL_CAPSULE | Freq: Three times a day (TID) | ORAL | 0 refills | Status: DC | PRN

## 2020-03-28 MED ORDER — POLYETHYLENE GLYCOL 3350 17 G PO PACK
17.0000 g | PACK | Freq: Every day | ORAL | Status: DC
  Administered 2020-03-28: 17 g via ORAL
  Filled 2020-03-28: qty 1

## 2020-03-28 MED ORDER — POLYETHYLENE GLYCOL 3350 17 G PO PACK: 17.0000 g | PACK | Freq: Every day | ORAL | 0 refills | Status: DC

## 2020-03-28 MED ORDER — ADULT MULTIVITAMIN W/MINERALS CH: 1.0000 | ORAL_TABLET | Freq: Every day | ORAL | Status: DC

## 2020-03-28 MED ORDER — METHOCARBAMOL 500 MG PO TABS: 1000.0000 mg | ORAL_TABLET | Freq: Three times a day (TID) | ORAL | 1 refills | Status: DC | PRN

## 2020-03-28 NOTE — TOC Transition Note (Signed)
Transition of Care [facility]) - CM/SW Discharge Note   Patient Details  Name: Troy Klein MRN: [id number] Date of Birth: May 03, 1971  [facility] of [facility]) CM/SW Contact:  Glennon Mac, RN Phone Number: 03/28/2020, 4:01 PM   Clinical Narrative:   Patient medically stable for discharge home today with family and home health services as previously arranged.  Referral to [employer] for recommended DME; wheelchair, rolling walker, and rolling walker requested for delivery to bedside prior to discharge.  No other discharge needs identified.  Patient prefers not to set up primary care MD appointment, as he is trying to find a new one.  He states he will call the number on the back of his insurance card to check to see who is in network with his insurance.     Final next level of care: Home w Home Health Services Barriers to Discharge: Barriers Resolved   Patient Goals and CMS Choice Patient states their goals for this hospitalization and ongoing recovery are:: to get back home CMS Medicare.gov Compare Post Acute Care list provided to:: Patient Choice offered to / list presented to : Patient                         Discharge Plan and Services   Discharge Planning Services: CM Consult Post Acute Care Choice: Home Health          DME Arranged: 3-N-1, Wheelchair manual, Walker rolling DME Agency: [employer] Date DME Agency Contacted: 03/28/20 Time DME Agency Contacted: 1020 Representative spoke with at DME Agency: Oletha Cruel HH Arranged: PT, OT HH Agency: [facility] Date [facility] Agency Contacted: 03/27/20 Time HH Agency Contacted: 1538 Representative spoke with at [facility] Agency: Lorenza Chick  Social Determinants of Health (SDOH) Interventions     Readmission Risk Interventions Readmission Risk Prevention Plan 03/28/2020  Post Dischage Appt Not Complete  Appt Comments Pt working on finding a new PCP, he plans to call the number on the back of his card to see who is  in network with his insurance.  Medication Screening Complete  Transportation Screening Complete  Some recent data might be hidden   Quintella Baton, RN, BSN  Trauma/Neuro ICU Case Manager [phone]
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
## 2020-03-28 NOTE — Progress Notes (Signed)
OT Cancellation Note  Patient Details Name: Troy Klein MRN: [id number] DOB: [birth year]   Cancelled Treatment:    Reason Eval/Treat Not Completed: Fatigue/lethargy limiting ability to participate.  Checked on pt to determine when family may be here for education.  Pt politely refused further OT services stating he is tired and wants to just get on home.  Will defer further OT to [facility].    [name]., OTR/L Acute Rehabilitation Services Pager [phone] Office [phone]   [name] 03/28/2020, 10:27 AM

## 2020-03-28 NOTE — Plan of Care (Signed)
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
## 2020-03-28 NOTE — Discharge Instructions (Signed)
Right Calcaneal fracture   Please remain non-weight bearing to your right lower extremity and in splint until you follow up with Dr. Victorino Dike. He would like to schedule you for operative repair next week. Please call their office on the day of discharge to arrange this.   Cast or Splint Care, Adult Casts and splints are supports that are worn to protect broken bones and other injuries. A cast or splint may hold a bone still and in the correct position while it heals. Casts and splints may also help ease pain, swelling, and muscle spasms. A cast is a hardened support that is usually made of fiberglass or plaster. It is custom-fit to the body and it offers more protection than a splint. It cannot be taken off and put back on. A splint is a type of soft support that is usually made from cloth and elastic. It can be adjusted or taken off as needed. You may need a cast or a splint if you:  Have a broken bone.  Have a soft-tissue injury.  Need to keep an injured body part from moving (keep it immobile) after surgery. How is this treated? If you have a cast:   Do not stick anything inside the cast to scratch your skin. Sticking something in the cast increases your risk of infection.  Check the skin around the cast every day. Tell your health care provider about any concerns.  You may put lotion on dry skin around the edges of the cast. Do not put lotion on the skin underneath the cast.  Keep the cast clean.  If the cast is not waterproof: ? Do not let it get wet. ? Cover it with a watertight covering when you take a bath or a shower. If you have a splint:   Wear it as told by your health care provider. Remove it only as told by your health care provider.  Loosen the splint if your fingers or toes tingle, become numb, or turn cold and blue.  Keep the splint clean.  If the splint is not waterproof: ? Do not let it get wet. ? Cover it with a watertight covering when you take a bath or a  shower. Bathing  Do not take baths or swim until your health care provider approves. Ask your health care provider if you can take showers. You may only be allowed to take sponge baths for bathing.  If your cast or splint is not waterproof, cover it with a watertight covering when you take a bath or shower. Managing pain, stiffness, and swelling  Move your fingers or toes often to avoid stiffness and to lessen swelling.  Raise (elevate) the injured area above the level of your heart while sitting or lying down. Safety  Do not use the injured limb to support your body weight until your health care provider says that it is okay.  Use crutches or other assistive devices as told by your health care provider. General instructions  Do not put pressure on any part of the cast or splint until it is fully hardened. This may take several hours.  Return to your normal activities as told by your health care provider. Ask your health care provider what activities are safe for you.  Take over-the-counter and prescription medicines only as told by your health care provider.  Keep all follow-up visits as told by your health care provider. This is important. Contact a health care provider if:  Your cast or splint gets  damaged.  The skin around the cast gets red or raw.  The skin under the cast is extremely itchy or painful.  Your cast or splint feels very uncomfortable.  Your cast or splint is too tight or too loose.  Your cast becomes wet or it develops a soft spot or area.  You get an object stuck under your cast. Get help right away if:  Your pain is getting worse.  The injured area tingles, becomes numb, or turns cold and blue.  The part of your body above or below the cast is swollen and discolored.  You cannot feel or move your fingers or toes.  There is fluid leaking through the cast.  You have severe pain or pressure under the cast.  You have trouble breathing.  You  have shortness of breath.  You have chest pain. This information is not intended to replace advice given to you by your health care provider. Make sure you discuss any questions you have with your health care provider. Document Revised: 06/21/2017 Document Reviewed: 02/15/2016 Elsevier Patient Education  2020 Else[employer] Inc[employer]   Sacral Fracture, Adult You can weight bear as tolerated to the left lower extremity Please follow up with Orthopedics  A pelvic fracture is a break in one of the bones in the pelvis. The pelvic bones include the bones that you sit on and the bones that make up the lower part of your spine. A pelvic fracture is called simple if:  There is only one break.  The broken bone is stable.  The broken bone is not moving out of place.  The bone does not pierce the skin. A pelvic fracture may occur along with injuries to nerves, blood vessels, soft tissues, the urinary tract, and abdominal organs. What are the causes? Common causes of this type of fracture include:  A fall.  A car accident.  Force or pressure that hits the pelvis. What increases the risk? You are more likely to get this injury if you:  Play high-impact sports, such as rugby or football.  You have thinning or weakening of your bones, such as from osteopenia or osteoporosis.  Have cancer that has spread to the bone.  Have a condition that is associated with falling, such as Parkinson's disease or seizure.  Have had a stroke.  Smoke. What are the signs or symptoms? Signs and symptoms may include:  Tenderness, swelling, or bruising in the affected area.  Pain when moving the hip.  Pain when walking or standing. How is this diagnosed? This condition is diagnosed with a physical exam, X-ray, or CT scan. You may also have blood or urine tests:  To rule out damage to other organs, such as the urethra.  To check for internal bleeding in the pelvic area. How is this treated? The goal of  treatment is to get the bone to heal in its original position. Treatment includes:  Staying in bed (bed rest).  Using crutches, a walker, or a wheelchair until the bone heals.  Medicines to treat pain.  Medicines to prevent blood clots from forming in your legs.  Physical therapy. Follow these instructions at home: Medicines  Take over-the-counter and prescription medicines only as told by your health care provider.  Do not drive or use heavy machinery while taking prescription pain medicine. Managing pain, stiffness, and swelling   If directed, apply ice to the injured area: ? Put ice in a plastic bag. ? Place a towel between your skin and the  bag. ? Leave the ice on for 20 minutes, 2-3 times a day.  Gently move your toes often to avoid stiffness and to lessen swelling. Activity  Stay on bed rest for as long as directed by your health care provider.  While on bed rest: ? Change the position of your legs every 1-2 hours. This keeps blood moving well through both of your legs. ? You may sit for as long as you feel comfortable.  After bed rest: ? Avoid strenuous activities for as long as directed by your health care provider. ? Return to your normal activities as directed by your health care provider. Ask your health care provider what activities are safe for you.  Use items to help you with your activities, such as: ? A long-handled shoehorn to help you put your shoes on. ? Elastic shoelaces that do not need to be retied. ? A reacher or grabber to pick items up off the floor. General instructions   Do not  drive or operate heavy machinery until your health care provider tells you it is safe to do so.  Use a wheelchair or assistive devices as directed by your health care provider. When you are ready to walk, start by using crutches or a walker to help support your body weight.  Have someone help you at home as you recover.  Wear compression stockings as told by your  health care provider.  Do not use any products that contain nicotine or tobacco, such as cigarettes and e-cigarettes. These can delay bone healing. If you need help quitting, ask your health care provider.  If you have an underlying condition that caused your pelvic fracture, work with your health care provider to manage your condition.  Keep all follow-up visits as told by your health care provider. This is important. Contact a health care provider if:  Your pain gets worse.  Your pain is not relieved with medicines. Get help right away if you:  Feel light-headed or faint.  Develop chest pain.  Develop shortness of breath.  Have a fever.  Have blood in your urine or your stools.  Have bleeding in your vagina.  Have difficulty or pain with urination or with passing stool.  Have difficulty or increased pain with walking.  Have new or increased swelling in one of your legs.  Have numbness in your legs or groin area. Summary  A pelvic fracture is a break in one of the bones in the pelvis. These are the bones that you sit on and the bones that make up the lower part of your spine.  A pelvic fracture is called simple if there is only one break, the broken bone is stable, the broken bone is not moving out of place, or the bone does not pierce the skin.  Common causes of this type of fracture include a fall, a car accident, or a force or pressure that hits the pelvis.  The goal of treatment is to get the bone to heal in its original position.  Treatment includes bed rest and using a wheelchair. When ready to walk, you may use crutches or a walker until your bone heals. Other treatments include physical therapy and medicine to treat pain and prevent blood clots. This information is not intended to replace advice given to you by your health care provider. Make sure you discuss any questions you have with your health care provider. Document Revised: 03/31/2018 Document Reviewed:  10/04/2017 Elsevier Patient Education  2020 [employer]  RIB FRACTURES  HOME INSTRUCTIONS   1. PAIN CONTROL:  1. Pain is best controlled by a usual combination of three different methods TOGETHER:  i. Ice/Heat ii. Over the counter pain medication iii. Prescription pain medication 2. You may experience some swelling and bruising in area of broken ribs. Ice packs or heating pads (30-60 minutes up to 6 times a day) will help. Use ice for the first few days to help decrease swelling and bruising, then switch to heat to help relax tight/sore spots and speed recovery. Some people prefer to use ice alone, heat alone, alternating between ice & heat. Experiment to what works for you. Swelling and bruising can take several weeks to resolve.  3. It is helpful to take an over-the-counter pain medication regularly for the first few weeks. Choose one of the following that works best for you:  i. Naproxen (Aleve, etc) Two 220mg  tabs twice a day ii. Ibuprofen (Advil, etc) Three 200mg  tabs four times a day (every meal & bedtime) iii. Acetaminophen (Tylenol, etc) 500-650mg  four times a day (every meal & bedtime) 4. A prescription for pain medication (such as oxycodone, hydrocodone, etc) may be given to you upon discharge. Take your pain medication as prescribed.  i. If you are having problems/concerns with the prescription medicine (does not control pain, nausea, vomiting, rash, itching, etc), please call [phone] to see if we need to switch you to a different pain medicine that will work better for you and/or control your side effect better. ii. If you need a refill on your pain medication, please contact your pharmacy. They will contact our office to request authorization. Prescriptions will not be filled after 5 pm or on week-ends. 1. Avoid getting constipated. When taking pain medications, it is common to experience some constipation. Increasing fluid intake and taking a fiber supplement (such as  Metamucil, Citrucel, FiberCon, MiraLax, etc) 1-2 times a day regularly will usually help prevent this problem from occurring. A mild laxative (prune juice, Milk of Magnesia, MiraLax, etc) should be taken according to package directions if there are no bowel movements after 48 hours.  2. Watch out for diarrhea. If you have many loose bowel movements, simplify your diet to bland foods & liquids for a few days. Stop any stool softeners and decrease your fiber supplement. Switching to mild anti-diarrheal medications (Kayopectate, Pepto Bismol) can help. If this worsens or does not improve, please call us. 3. FOLLOW UP  a. If a follow up appointment is needed one will be scheduled for you. If none is needed with our trauma team, please follow up with your primary care provider within 2-3 weeks from discharge. Please call [facility] at [phone] if you have any questions about follow up.  b. If you have any orthopedic or other injuries you will need to follow up as outlined in your follow up instructions.   WHEN TO CALL us [phone]:  1. Poor pain control 2. Reactions / problems with new medications (rash/itching, nausea, etc)  3. Fever over 101.5 F (38.5 C) 4. Worsening swelling or bruising 5. Worsening pain, productive cough, difficulty breathing or any other concerning symptoms  The clinic staff is available to answer your questions during regular business hours (8:30am-5pm). Please don't hesitate to call and ask to speak to one of our nurses for clinical concerns.  If you have a medical emergency, go to the nearest emergency room or call 911.  A surgeon from [facility] is always on call  at the [facility], [place]  [address], [address], [place], [place] [address] ?  MAIN: [phone] ? TOLL FREE: [phone] ?  FAX [phone]  [email or web address]      Information on Rib Fractures  A rib fracture is a break or crack in one  of the bones of the ribs. The ribs are long, curved bones that wrap around your chest and attach to your spine and your breastbone. The ribs protect your heart, lungs, and other organs in the chest. A broken or cracked rib is often painful but is not usually serious. Most rib fractures heal on their own over time. However, rib fractures can be more serious if multiple ribs are broken or if broken ribs move out of place and push against other structures or organs. What are the causes? This condition is caused by:  Repetitive movements with high force, such as pitching a baseball or having severe coughing spells.  A direct blow to the chest, such as a sports injury, a car accident, or a fall.  Cancer that has spread to the bones, which can weaken bones and cause them to break. What are the signs or symptoms? Symptoms of this condition include:  Pain when you breathe in or cough.  Pain when someone presses on the injured area.  Feeling short of breath. How is this diagnosed? This condition is diagnosed with a physical exam and medical history. Imaging tests may also be done, such as:  Chest X-ray.  CT scan.  MRI.  Bone scan.  Chest ultrasound. How is this treated? Treatment for this condition depends on the severity of the fracture. Most rib fractures usually heal on their own in 1-3 months. Sometimes healing takes longer if there is a cough that does not stop or if there are other activities that make the injury worse (aggravating factors). While you heal, you will be given medicines to control the pain. You will also be taught deep breathing exercises. Severe injuries may require hospitalization or surgery. Follow these instructions at home: Managing pain, stiffness, and swelling  If directed, apply ice to the injured area. ? Put ice in a plastic bag. ? Place a towel between your skin and the bag. ? Leave the ice on for 20 minutes, 2-3 times a day.  Take over-the-counter and  prescription medicines only as told by your health care provider. Activity  Avoid a lot of activity and any activities or movements that cause pain. Be careful during activities and avoid bumping the injured rib.  Slowly increase your activity as told by your health care provider. General instructions  Do deep breathing exercises as told by your health care provider. This helps prevent pneumonia, which is a common complication of a broken rib. Your health care provider may instruct you to: ? Take deep breaths several times a day. ? Try to cough several times a day, holding a pillow against the injured area. ? Use a device called incentive spirometer to practice deep breathing several times a day.  Drink enough fluid to keep your urine pale yellow.  Do not wear a rib belt or binder. These restrict breathing, which can lead to pneumonia.  Keep all follow-up visits as told by your health care provider. This is important. Contact a health care provider if:  You have a fever. Get help right away if:  You have difficulty breathing or you are short of breath.  You develop a cough that does  not stop, or you cough up thick or bloody sputum.  You have nausea, vomiting, or pain in your abdomen.  Your pain gets worse and medicine does not help. Summary  A rib fracture is a break or crack in one of the bones of the ribs.  A broken or cracked rib is often painful but is not usually serious.  Most rib fractures heal on their own over time.  Treatment for this condition depends on the severity of the fracture.  Avoid a lot of activity and any activities or movements that cause pain. This information is not intended to replace advice given to you by your health care provider. Make sure you discuss any questions you have with your health care provider. Document Released: 08/24/2005 Document Revised: 11/23/2016 Document Reviewed: 11/23/2016 Elsevier Interactive Patient Education  2019  Elsevier Inc.   Wound Care, Adult Taking care of your wound properly can help to prevent pain, infection, and scarring. It can also help your wound to heal more quickly. How to care for your wound Wound care      Follow instructions from your health care provider about how to take care of your wound. Make sure you: ? Wash your hands with soap and water before you change the bandage (dressing). If soap and water are not available, use hand sanitizer. ? Change your dressing as told by your health care provider. ? Leave stitches (sutures), skin glue, or adhesive strips in place. These skin closures may need to stay in place for 2 weeks or longer. If adhesive strip edges start to loosen and curl up, you may trim the loose edges. Do not remove adhesive strips completely unless your health care provider tells you to do that.  Check your wound area every day for signs of infection. Check for: ? Redness, swelling, or pain. ? Fluid or blood. ? Warmth. ? Pus or a bad smell.  Ask your health care provider if you should clean the wound with mild soap and water. Doing this may include: ? Using a clean towel to pat the wound dry after cleaning it. Do not rub or scrub the wound. ? Applying a cream or ointment. Do this only as told by your health care provider. ? Covering the incision with a clean dressing.  Ask your health care provider when you can leave the wound uncovered.  Keep the dressing dry until your health care provider says it can be removed. Do not take baths, swim, use a hot tub, or do anything that would put the wound underwater until your health care provider approves. Ask your health care provider if you can take showers. You may only be allowed to take sponge baths. Medicines   If you were prescribed an antibiotic medicine, cream, or ointment, take or use the antibiotic as told by your health care provider. Do not stop taking or using the antibiotic even if your condition  improves.  Take over-the-counter and prescription medicines only as told by your health care provider. If you were prescribed pain medicine, take it 30 or more minutes before you do any wound care or as told by your health care provider. General instructions  Return to your normal activities as told by your health care provider. Ask your health care provider what activities are safe.  Do not scratch or pick at the wound.  Do not use any products that contain nicotine or tobacco, such as cigarettes and e-cigarettes. These may delay wound healing. If you need help  quitting, ask your health care provider.  Keep all follow-up visits as told by your health care provider. This is important.  Eat a diet that includes protein, vitamin A, vitamin C, and other nutrient-rich foods to help the wound heal. ? Foods rich in protein include meat, dairy, beans, nuts, and other sources. ? Foods rich in vitamin A include carrots and dark green, leafy vegetables. ? Foods rich in vitamin C include citrus, tomatoes, and other fruits and vegetables. ? Nutrient-rich foods have protein, carbohydrates, fat, vitamins, or minerals. Eat a variety of healthy foods including vegetables, fruits, and whole grains. Contact a health care provider if:  You received a tetanus shot and you have swelling, severe pain, redness, or bleeding at the injection site.  Your pain is not controlled with medicine.  You have redness, swelling, or pain around the wound.  You have fluid or blood coming from the wound.  Your wound feels warm to the touch.  You have pus or a bad smell coming from the wound.  You have a fever or chills.  You are nauseous or you vomit.  You are dizzy. Get help right away if:  You have a red streak going away from your wound.  The edges of the wound open up and separate.  Your wound is bleeding, and the bleeding does not stop with gentle pressure.  You have a rash.  You faint.  You have  trouble breathing. Summary  Always wash your hands with soap and water before changing your bandage (dressing).  To help with healing, eat foods that are rich in protein, vitamin A, vitamin C, and other nutrients.  Check your wound every day for signs of infection. Contact your health care provider if you suspect that your wound is infected. This information is not intended to replace advice given to you by your health care provider. Make sure you discuss any questions you have with your health care provider. Document Revised: 12/12/2018 Document Reviewed: 03/10/2016 Elsevier Patient Education  2020 [employer].

## 2020-03-29 ENCOUNTER — Other Ambulatory Visit: Payer: Self-pay

## 2020-03-29 ENCOUNTER — Encounter (HOSPITAL_BASED_OUTPATIENT_CLINIC_OR_DEPARTMENT_OTHER): Payer: Self-pay | Admitting: Orthopedic Surgery

## 2020-03-29 ENCOUNTER — Telehealth: Payer: Self-pay

## 2020-03-29 NOTE — Telephone Encounter (Signed)
Made call to patient to f /u on ED visit . DOB and Name verified. Pt refused f /u call and appointment and  stated has been very unsatisfied with [facility] and no longer wants to be a patient at this clinic. Unable to offer other services or being seen at our other clinics as he hang up the phone.

## 2020-04-01 ENCOUNTER — Other Ambulatory Visit (HOSPITAL_COMMUNITY)
Admission: RE | Admit: 2020-04-01 | Discharge: 2020-04-01 | Disposition: A | Discharge status: Home / Self Care | Payer: Managed Care, Other (non HMO) | Source: Ambulatory Visit | Attending: Orthopedic Surgery | Admitting: Orthopedic Surgery

## 2020-04-01 DIAGNOSIS — Z01812 Encounter for preprocedural laboratory examination: Secondary | ICD-10-CM | POA: Diagnosis present

## 2020-04-01 DIAGNOSIS — Z20822 Contact with and (suspected) exposure to covid-19: Secondary | ICD-10-CM | POA: Diagnosis not present

## 2020-04-01 LAB — SARS CORONAVIRUS 2 (TAT 6-24 HRS): SARS Coronavirus 2: NEGATIVE

## 2020-04-04 ENCOUNTER — Ambulatory Visit (HOSPITAL_BASED_OUTPATIENT_CLINIC_OR_DEPARTMENT_OTHER)
Admission: RE | Admit: 2020-04-04 | Discharge: 2020-04-04 | Disposition: A | Discharge status: Home / Self Care | Payer: Managed Care, Other (non HMO) | Attending: Orthopedic Surgery | Admitting: Orthopedic Surgery

## 2020-04-04 ENCOUNTER — Encounter (HOSPITAL_BASED_OUTPATIENT_CLINIC_OR_DEPARTMENT_OTHER): Payer: Self-pay | Admitting: Orthopedic Surgery

## 2020-04-04 ENCOUNTER — Ambulatory Visit (HOSPITAL_BASED_OUTPATIENT_CLINIC_OR_DEPARTMENT_OTHER): Payer: Managed Care, Other (non HMO) | Admitting: Anesthesiology

## 2020-04-04 ENCOUNTER — Encounter (HOSPITAL_BASED_OUTPATIENT_CLINIC_OR_DEPARTMENT_OTHER)
Admission: RE | Disposition: A | Discharge status: Home / Self Care | Payer: Self-pay | Source: Home / Self Care | Attending: Orthopedic Surgery

## 2020-04-04 DIAGNOSIS — Y939 Activity, unspecified: Secondary | ICD-10-CM | POA: Insufficient documentation

## 2020-04-04 DIAGNOSIS — S37012A Minor contusion of left kidney, initial encounter: Secondary | ICD-10-CM | POA: Diagnosis not present

## 2020-04-04 DIAGNOSIS — S92001A Unspecified fracture of right calcaneus, initial encounter for closed fracture: Secondary | ICD-10-CM | POA: Diagnosis not present

## 2020-04-04 DIAGNOSIS — Z87891 Personal history of nicotine dependence: Secondary | ICD-10-CM | POA: Insufficient documentation

## 2020-04-04 DIAGNOSIS — Z79899 Other long term (current) drug therapy: Secondary | ICD-10-CM | POA: Diagnosis not present

## 2020-04-04 DIAGNOSIS — S3210XA Unspecified fracture of sacrum, initial encounter for closed fracture: Secondary | ICD-10-CM | POA: Diagnosis not present

## 2020-04-04 DIAGNOSIS — S2231XA Fracture of one rib, right side, initial encounter for closed fracture: Secondary | ICD-10-CM | POA: Insufficient documentation

## 2020-04-04 HISTORY — DX: Unspecified fracture of unspecified calcaneus, initial encounter for closed fracture: S92.009A

## 2020-04-04 HISTORY — DX: Unspecified fracture of sacrum, initial encounter for closed fracture: S32.10XA

## 2020-04-04 HISTORY — PX: ORIF CALCANEOUS FRACTURE: SHX5030

## 2020-04-04 SURGERY — OPEN REDUCTION INTERNAL FIXATION (ORIF) CALCANEOUS FRACTURE
Anesthesia: General | Site: Foot | Laterality: Right

## 2020-04-04 MED ORDER — ACETAMINOPHEN 10 MG/ML IV SOLN: 1000.0000 mg | Freq: Once | INTRAVENOUS | Status: DC | PRN

## 2020-04-04 MED ORDER — OXYCODONE HCL 5 MG/5ML PO SOLN: 5.0000 mg | Freq: Once | ORAL | Status: DC | PRN

## 2020-04-04 MED ORDER — ACETAMINOPHEN 160 MG/5ML PO SOLN: 1000.0000 mg | Freq: Once | ORAL | Status: DC | PRN

## 2020-04-04 MED ORDER — FENTANYL CITRATE (PF) 100 MCG/2ML IJ SOLN
100.0000 ug | Freq: Once | INTRAMUSCULAR | Status: AC
  Administered 2020-04-04: 150 ug via INTRAVENOUS

## 2020-04-04 MED ORDER — OXYCODONE HCL 5 MG PO TABS
5.0000 mg | ORAL_TABLET | Freq: Four times a day (QID) | ORAL | 0 refills | Status: AC | PRN
Start: 2020-04-04 — End: 2020-04-09

## 2020-04-04 MED ORDER — ONDANSETRON HCL 4 MG/2ML IJ SOLN
INTRAMUSCULAR | Status: DC | PRN
  Administered 2020-04-04: 4 mg via INTRAVENOUS

## 2020-04-04 MED ORDER — ACETAMINOPHEN 500 MG PO TABS: 1000.0000 mg | ORAL_TABLET | Freq: Once | ORAL | Status: DC | PRN

## 2020-04-04 MED ORDER — MIDAZOLAM HCL 2 MG/2ML IJ SOLN
2.0000 mg | Freq: Once | INTRAMUSCULAR | Status: AC
  Administered 2020-04-04: 3 mg via INTRAVENOUS

## 2020-04-04 MED ORDER — VANCOMYCIN HCL 500 MG IV SOLR
INTRAVENOUS | Status: AC
  Filled 2020-04-04: qty 500

## 2020-04-04 MED ORDER — FENTANYL CITRATE (PF) 100 MCG/2ML IJ SOLN
INTRAMUSCULAR | Status: AC
  Filled 2020-04-04: qty 2

## 2020-04-04 MED ORDER — DEXAMETHASONE SODIUM PHOSPHATE 10 MG/ML IJ SOLN
INTRAMUSCULAR | Status: DC | PRN
  Administered 2020-04-04: 5 mg via INTRAVENOUS

## 2020-04-04 MED ORDER — CEFAZOLIN SODIUM-DEXTROSE 2-4 GM/100ML-% IV SOLN
INTRAVENOUS | Status: AC
  Filled 2020-04-04: qty 100

## 2020-04-04 MED ORDER — PROPOFOL 10 MG/ML IV BOLUS
INTRAVENOUS | Status: AC
  Filled 2020-04-04: qty 40

## 2020-04-04 MED ORDER — MIDAZOLAM HCL 2 MG/2ML IJ SOLN
INTRAMUSCULAR | Status: AC
  Filled 2020-04-04: qty 2

## 2020-04-04 MED ORDER — LIDOCAINE HCL (CARDIAC) PF 100 MG/5ML IV SOSY
PREFILLED_SYRINGE | INTRAVENOUS | Status: DC | PRN
Start: 2020-04-04 — End: 2020-04-04
  Administered 2020-04-04: 60 mg via INTRATRACHEAL

## 2020-04-04 MED ORDER — LACTATED RINGERS IV SOLN: INTRAVENOUS | Status: DC

## 2020-04-04 MED ORDER — CEFAZOLIN SODIUM-DEXTROSE 2-4 GM/100ML-% IV SOLN
2.0000 g | INTRAVENOUS | Status: AC
  Administered 2020-04-04: 2 g via INTRAVENOUS

## 2020-04-04 MED ORDER — PROPOFOL 10 MG/ML IV BOLUS
INTRAVENOUS | Status: DC | PRN
Start: 2020-04-04 — End: 2020-04-04
  Administered 2020-04-04: 200 mg via INTRAVENOUS
  Administered 2020-04-04: 100 mg via INTRAVENOUS

## 2020-04-04 MED ORDER — OXYCODONE HCL 5 MG PO TABS: 5.0000 mg | ORAL_TABLET | Freq: Once | ORAL | Status: DC | PRN

## 2020-04-04 MED ORDER — FENTANYL CITRATE (PF) 100 MCG/2ML IJ SOLN: 25.0000 ug | INTRAMUSCULAR | Status: DC | PRN

## 2020-04-04 MED ORDER — SODIUM CHLORIDE 0.9 % IV SOLN: INTRAVENOUS | Status: DC

## 2020-04-04 MED ORDER — FENTANYL CITRATE (PF) 100 MCG/2ML IJ SOLN
INTRAMUSCULAR | Status: DC | PRN
  Administered 2020-04-04 (×2): 25 ug via INTRAVENOUS
  Administered 2020-04-04: 50 ug via INTRAVENOUS

## 2020-04-04 MED ORDER — VANCOMYCIN HCL 500 MG IV SOLR
INTRAVENOUS | Status: DC | PRN
  Administered 2020-04-04: 500 mg via TOPICAL

## 2020-04-04 MED ORDER — 0.9 % SODIUM CHLORIDE (POUR BTL) OPTIME
TOPICAL | Status: DC | PRN
  Administered 2020-04-04: 200 mL

## 2020-04-04 MED ORDER — PROPRANOLOL HCL 1 MG/ML IV SOLN
INTRAVENOUS | Status: AC
  Filled 2020-04-04: qty 1

## 2020-04-04 SURGICAL SUPPLY — 74 items
APL PRP STRL LF DISP 70% ISPRP (MISCELLANEOUS) ×1
BANDAGE ESMARK 6X9 LF (GAUZE/BANDAGES/DRESSINGS) IMPLANT
BIT DRILL 2.5X2.75 QC CALB (BIT) ×2 IMPLANT
BIT DRILL 3.5X5.5 QC CALB (BIT) ×2 IMPLANT
BIT DRILL CALIBRATED 2.7 (BIT) ×2 IMPLANT
BLADE SURG 15 STRL LF DISP TIS (BLADE) ×3 IMPLANT
BLADE SURG 15 STRL SS (BLADE) ×6
BNDG CMPR 9X6 STRL LF SNTH (GAUZE/BANDAGES/DRESSINGS)
BNDG COHESIVE 4X5 TAN STRL (GAUZE/BANDAGES/DRESSINGS) ×2 IMPLANT
BNDG COHESIVE 6X5 TAN STRL LF (GAUZE/BANDAGES/DRESSINGS) ×2 IMPLANT
BNDG ESMARK 6X9 LF (GAUZE/BANDAGES/DRESSINGS)
CHLORAPREP W/TINT 26 (MISCELLANEOUS) ×2 IMPLANT
COVER BACK TABLE 60X90IN (DRAPES) ×2 IMPLANT
COVER WAND RF STERILE (DRAPES) IMPLANT
CUFF TOURN SGL QUICK 34 (TOURNIQUET CUFF) ×2
CUFF TRNQT CYL 34X4.125X (TOURNIQUET CUFF) ×1 IMPLANT
DECANTER SPIKE VIAL GLASS SM (MISCELLANEOUS) IMPLANT
DRAPE EXTREMITY T 121X128X90 (DISPOSABLE) ×2 IMPLANT
DRAPE OEC MINIVIEW 54X84 (DRAPES) ×2 IMPLANT
DRAPE U-SHAPE 47X51 STRL (DRAPES) ×2 IMPLANT
DRSG MEPITEL 4X7.2 (GAUZE/BANDAGES/DRESSINGS) ×2 IMPLANT
DRSG PAD ABDOMINAL 8X10 ST (GAUZE/BANDAGES/DRESSINGS) ×4 IMPLANT
ELECT REM PT RETURN 9FT ADLT (ELECTROSURGICAL) ×2
ELECTRODE REM PT RTRN 9FT ADLT (ELECTROSURGICAL) ×1 IMPLANT
GAUZE SPONGE 4X4 12PLY STRL (GAUZE/BANDAGES/DRESSINGS) ×2 IMPLANT
GLOVE BIO SURGEON STRL SZ8 (GLOVE) ×2 IMPLANT
GLOVE BIOGEL PI IND STRL 7.0 (GLOVE) ×2 IMPLANT
GLOVE BIOGEL PI IND STRL 8 (GLOVE) ×1 IMPLANT
GLOVE BIOGEL PI INDICATOR 7.0 (GLOVE) ×2
GLOVE BIOGEL PI INDICATOR 8 (GLOVE) ×1
GLOVE ECLIPSE 8.0 STRL XLNG CF (GLOVE) IMPLANT
GLOVE SURG SS PI 7.0 STRL IVOR (GLOVE) ×2 IMPLANT
GOWN STRL REUS W/ TWL LRG LVL3 (GOWN DISPOSABLE) ×1 IMPLANT
GOWN STRL REUS W/ TWL XL LVL3 (GOWN DISPOSABLE) ×2 IMPLANT
GOWN STRL REUS W/TWL LRG LVL3 (GOWN DISPOSABLE) ×2
GOWN STRL REUS W/TWL XL LVL3 (GOWN DISPOSABLE) ×4
K-WIRE ACE 1.6X6 (WIRE) ×8
KWIRE ACE 1.6X6 (WIRE) ×4 IMPLANT
NS IRRIG 1000ML POUR BTL (IV SOLUTION) ×2 IMPLANT
PACK BASIN DAY SURGERY FS (CUSTOM PROCEDURE TRAY) ×2 IMPLANT
PAD CAST 4YDX4 CTTN HI CHSV (CAST SUPPLIES) ×1 IMPLANT
PADDING CAST ABS 4INX4YD NS (CAST SUPPLIES) ×1
PADDING CAST ABS COTTON 4X4 ST (CAST SUPPLIES) ×1 IMPLANT
PADDING CAST COTTON 4X4 STRL (CAST SUPPLIES) ×2
PADDING CAST COTTON 6X4 STRL (CAST SUPPLIES) ×2 IMPLANT
PENCIL SMOKE EVACUATOR (MISCELLANEOUS) ×2 IMPLANT
PLATE LOCK CALC MIS 2H LG RT (Plate) ×2 IMPLANT
SANITIZER HAND PURELL 535ML FO (MISCELLANEOUS) ×2 IMPLANT
SCREW LOCK CORT STAR 3.5X22 (Screw) ×2 IMPLANT
SCREW LOCK CORT STAR 3.5X36 (Screw) ×4 IMPLANT
SCREW LOW PROFILE 3.5X30MM TIS (Screw) ×2 IMPLANT
SCREW T15 LP CORT 3.5X36MM NS (Screw) ×4 IMPLANT
SCREW T15 LP CORT 3.5X40MM NS (Screw) ×2 IMPLANT
SCREW T15 LP CORT 3.5X42MM NS (Screw) ×2 IMPLANT
SCREW T15 LP CORT 3.5X46MM NS (Screw) ×2 IMPLANT
SHEET MEDIUM DRAPE 40X70 STRL (DRAPES) ×2 IMPLANT
SLEEVE SCD COMPRESS KNEE MED (MISCELLANEOUS) ×2 IMPLANT
SPLINT FAST PLASTER 5X30 (CAST SUPPLIES) ×20
SPLINT PLASTER CAST FAST 5X30 (CAST SUPPLIES) ×20 IMPLANT
SPONGE LAP 18X18 RF (DISPOSABLE) ×2 IMPLANT
STOCKINETTE 6  STRL (DRAPES) ×2
STOCKINETTE 6 STRL (DRAPES) ×1 IMPLANT
SUCTION FRAZIER HANDLE 10FR (MISCELLANEOUS) ×2
SUCTION TUBE FRAZIER 10FR DISP (MISCELLANEOUS) ×1 IMPLANT
SUT ETHILON 3 0 PS 1 (SUTURE) ×2 IMPLANT
SUT MNCRL AB 3-0 PS2 18 (SUTURE) ×2 IMPLANT
SUT VIC AB 0 SH 27 (SUTURE) ×2 IMPLANT
SUT VIC AB 2-0 SH 18 (SUTURE) IMPLANT
SUT VIC AB 2-0 SH 27 (SUTURE)
SUT VIC AB 2-0 SH 27XBRD (SUTURE) IMPLANT
SYR BULB EAR ULCER 3OZ GRN STR (SYRINGE) ×2 IMPLANT
TOWEL GREEN STERILE FF (TOWEL DISPOSABLE) ×4 IMPLANT
TUBE CONNECTING 20X1/4 (TUBING) ×2 IMPLANT
UNDERPAD 30X36 HEAVY ABSORB (UNDERPADS AND DIAPERS) ×2 IMPLANT
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
## 2020-04-04 NOTE — Discharge Instructions (Signed)
Toni Arthurs, MD [facility]  Please read the following information regarding your care after surgery.  Medications  You only need a prescription for the narcotic pain medicine (ex. oxycodone, Percocet, Norco).  All of the other medicines listed below are available over the counter. X Aleve 2 pills twice a day for the first 3 days after surgery. X acetominophen (Tylenol) 650 mg every 4-6 hours as you need for minor to moderate pain X oxycodone as prescribed for severe pain  Narcotic pain medicine (ex. oxycodone, Percocet, Vicodin) will cause constipation.  To prevent this problem, take the following medicines while you are taking any pain medicine. X docusate sodium (Colace) 100 mg twice a day X senna (Senokot) 2 tablets twice a day  X To help prevent blood clots, take a baby aspirin (81 mg) twice a day for two weeks after surgery.  You should also get up every hour while you are awake to move around.    Weight Bearing ? Bear weight when you are able on your operated leg or foot. ? Bear weight only on your operated foot in the post-op shoe. X Do not bear any weight on the operated leg or foot.  Cast / Splint / Dressing X Keep your splint, cast or dressing clean and dry.  Don't put anything (coat hanger, pencil, etc) down inside of it.  If it gets damp, use a hair dryer on the cool setting to dry it.  If it gets soaked, call the office to schedule an appointment for a cast change. ? Remove your dressing 3 days after surgery and cover the incisions with dry dressings.    After your dressing, cast or splint is removed; you may shower, but do not soak or scrub the wound.  Allow the water to run over it, and then gently pat it dry.  Swelling It is normal for you to have swelling where you had surgery.  To reduce swelling and pain, keep your toes above your nose for at least 3 days after surgery.  It may be necessary to keep your foot or leg elevated for several weeks.  If it hurts, it should be  elevated.  Follow Up Call my office at [phone] when you are discharged from the hospital or surgery center to schedule an appointment to be seen two weeks after surgery.  Call my office at [phone] if you develop a fever >101.5 F, nausea, vomiting, bleeding from the surgical site or severe pain.     Post Anesthesia Home Care Instructions  Activity: Get plenty of rest for the remainder of the day. A responsible individual must stay with you for 24 hours following the procedure.  For the next 24 hours, DO NOT: -Drive a car -Advertising copywriter -Drink alcoholic beverages -Take any medication unless instructed by your physician -Make any legal decisions or sign important papers.  Meals: Start with liquid foods such as gelatin or soup. Progress to regular foods as tolerated. Avoid greasy, spicy, heavy foods. If nausea and/or vomiting occur, drink only clear liquids until the nausea and/or vomiting subsides. Call your physician if vomiting continues.  Special Instructions/Symptoms: Your throat may feel dry or sore from the anesthesia or the breathing tube placed in your throat during surgery. If this causes discomfort, gargle with warm salt water. The discomfort should disappear within 24 hours.      Regional Anesthesia Blocks  1. Numbness or the inability to move the "blocked" extremity may last from 3-48 hours after placement. The length  of time depends on the medication injected and your individual response to the medication. If the numbness is not going away after 48 hours, call your surgeon.  2. The extremity that is blocked will need to be protected until the numbness is gone and the  Strength has returned. Because you cannot feel it, you will need to take extra care to avoid injury. Because it may be weak, you may have difficulty moving it or using it. You may not know what position it is in without looking at it while the block is in effect.  3. For blocks in the legs and  feet, returning to weight bearing and walking needs to be done carefully. You will need to wait until the numbness is entirely gone and the strength has returned. You should be able to move your leg and foot normally before you try and bear weight or walk. You will need someone to be with you when you first try to ensure you do not fall and possibly risk injury.  4. Bruising and tenderness at the needle site are common side effects and will resolve in a few days.  5. Persistent numbness or new problems with movement should be communicated to the surgeon or the [facility] [phone] [facility] [phone]).

## 2020-04-04 NOTE — Progress Notes (Signed)
Assisted Dr. Moser with right, ultrasound guided, popliteal block. Side rails up, monitors on throughout procedure. See vital signs in flow sheet. Tolerated Procedure well. 
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
## 2020-04-04 NOTE — Transfer of Care (Signed)
Immediate Anesthesia Transfer of Care Note  Patient: Troy Klein  Procedure(s) Performed: OPEN REDUCTION INTERNAL FIXATION (ORIF) CALCANEOUS FRACTURE (Right Foot)  Patient Location: PACU  Anesthesia Type:GA combined with regional for post-op pain  Level of Consciousness: sedated  Airway & Oxygen Therapy: Patient Spontanous Breathing and Patient connected to face mask oxygen  Post-op Assessment: Report given to RN and Post -op Vital signs reviewed and stable  Post vital signs: Reviewed and stable  Last Vitals:  Vitals Value Taken Time  BP 126/79 04/04/20 1541  Temp 36.4 C 04/04/20 1541  Pulse 63 04/04/20 1542  Resp 12 04/04/20 1542  SpO2 100 % 04/04/20 1542  Vitals shown include unvalidated device data.  Last Pain:  Vitals:   04/04/20 1204  TempSrc: Oral  PainSc: 6          Complications: No complications documented.

## 2020-04-04 NOTE — Anesthesia Procedure Notes (Signed)
Procedure Name: LMA Insertion Date/Time: 04/04/2020 1:54 PM Performed by: Lauralyn Primes, CRNA Pre-anesthesia Checklist: Patient identified, Emergency Drugs available, Suction available and Patient being monitored Patient Re-evaluated:Patient Re-evaluated prior to induction Oxygen Delivery Method: Circle system utilized Preoxygenation: Pre-oxygenation with 100% oxygen Induction Type: IV induction Ventilation: Mask ventilation without difficulty LMA: LMA inserted LMA Size: 5.0 Number of attempts: 1 Airway Equipment and Method: Bite block Placement Confirmation: positive ETCO2 Tube secured with: Tape Dental Injury: Teeth and Oropharynx as per pre-operative assessment
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
## 2020-04-04 NOTE — Anesthesia Postprocedure Evaluation (Signed)
Anesthesia Post Note  Patient: Delwyn E Deringer  Procedure(s) Performed: OPEN REDUCTION INTERNAL FIXATION (ORIF) CALCANEOUS FRACTURE (Right Foot)     Patient location during evaluation: PACU Anesthesia Type: General and Regional Level of consciousness: awake and alert Pain management: pain level controlled Vital Signs Assessment: post-procedure vital signs reviewed and stable Respiratory status: spontaneous breathing, nonlabored ventilation, respiratory function stable and patient connected to nasal cannula oxygen Cardiovascular status: blood pressure returned to baseline and stable Postop Assessment: no apparent nausea or vomiting Anesthetic complications: no   No complications documented.  Last Vitals:  Vitals:   04/04/20 1600 04/04/20 1645  BP: (!) 129/89 (!) 130/92  Pulse: 59 74  Resp: 13 16  Temp:  36.5 C  SpO2: 100% 100%    Last Pain:  Vitals:   04/04/20 1645  TempSrc:   PainSc: 0-No pain                 Trayonna Bachmeier

## 2020-04-05 ENCOUNTER — Encounter (HOSPITAL_BASED_OUTPATIENT_CLINIC_OR_DEPARTMENT_OTHER): Payer: Self-pay | Admitting: Orthopedic Surgery

## 2020-08-07 DIAGNOSIS — M25552 Pain in left hip: Secondary | ICD-10-CM | POA: Insufficient documentation

## 2020-08-13 ENCOUNTER — Other Ambulatory Visit: Payer: Self-pay

## 2020-08-20 ENCOUNTER — Other Ambulatory Visit: Payer: Self-pay

## 2020-08-20 ENCOUNTER — Encounter: Payer: Self-pay | Admitting: Vascular Surgery

## 2020-08-20 ENCOUNTER — Ambulatory Visit (INDEPENDENT_AMBULATORY_CARE_PROVIDER_SITE_OTHER): Payer: Managed Care, Other (non HMO) | Admitting: Vascular Surgery

## 2020-08-20 DIAGNOSIS — I82422 Acute embolism and thrombosis of left iliac vein: Secondary | ICD-10-CM | POA: Diagnosis not present

## 2020-08-20 DIAGNOSIS — I82409 Acute embolism and thrombosis of unspecified deep veins of unspecified lower extremity: Secondary | ICD-10-CM | POA: Insufficient documentation

## 2020-08-20 MED ORDER — RIVAROXABAN (XARELTO) VTE STARTER PACK (15 & 20 MG): ORAL_TABLET | ORAL | 0 refills | Status: DC
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
## 2020-08-26 ENCOUNTER — Telehealth: Payer: Self-pay

## 2020-08-26 NOTE — Telephone Encounter (Signed)
Called and left VM, patient prior auth for blood thinner is approved. Faxed approval to pharmacy.

## 2020-08-28 ENCOUNTER — Other Ambulatory Visit (HOSPITAL_COMMUNITY)
Admission: RE | Admit: 2020-08-28 | Discharge: 2020-08-28 | Disposition: A | Discharge status: Home / Self Care | Payer: Managed Care, Other (non HMO) | Source: Ambulatory Visit | Attending: Vascular Surgery | Admitting: Vascular Surgery

## 2020-08-28 DIAGNOSIS — Z20822 Contact with and (suspected) exposure to covid-19: Secondary | ICD-10-CM | POA: Insufficient documentation

## 2020-08-28 DIAGNOSIS — Z01812 Encounter for preprocedural laboratory examination: Secondary | ICD-10-CM | POA: Insufficient documentation

## 2020-08-28 LAB — SARS CORONAVIRUS 2 (TAT 6-24 HRS): SARS Coronavirus 2: NEGATIVE

## 2020-08-29 ENCOUNTER — Ambulatory Visit (HOSPITAL_COMMUNITY)
Admission: RE | Admit: 2020-08-29 | Discharge: 2020-08-29 | Disposition: A | Discharge status: Home / Self Care | Payer: Managed Care, Other (non HMO) | Attending: Vascular Surgery | Admitting: Vascular Surgery

## 2020-08-29 ENCOUNTER — Encounter (HOSPITAL_COMMUNITY): Payer: Self-pay | Admitting: Vascular Surgery

## 2020-08-29 ENCOUNTER — Encounter (HOSPITAL_COMMUNITY)
Admission: RE | Disposition: A | Discharge status: Home / Self Care | Payer: Self-pay | Source: Home / Self Care | Attending: Vascular Surgery

## 2020-08-29 DIAGNOSIS — Z87891 Personal history of nicotine dependence: Secondary | ICD-10-CM | POA: Diagnosis not present

## 2020-08-29 DIAGNOSIS — I82422 Acute embolism and thrombosis of left iliac vein: Secondary | ICD-10-CM

## 2020-08-29 DIAGNOSIS — Z791 Long term (current) use of non-steroidal anti-inflammatories (NSAID): Secondary | ICD-10-CM | POA: Insufficient documentation

## 2020-08-29 DIAGNOSIS — I82220 Acute embolism and thrombosis of inferior vena cava: Secondary | ICD-10-CM | POA: Insufficient documentation

## 2020-08-29 HISTORY — PX: PERIPHERAL VASCULAR THROMBECTOMY: CATH118306

## 2020-08-29 LAB — POCT I-STAT, CHEM 8
BUN: 5 mg/dL — ABNORMAL LOW (ref 6–20)
Calcium, Ion: 1.17 mmol/L (ref 1.15–1.40)
Chloride: 106 mmol/L (ref 98–111)
Creatinine, Ser: 0.7 mg/dL (ref 0.61–1.24)
Glucose, Bld: 93 mg/dL (ref 70–99)
HCT: 40 % (ref 39.0–52.0)
Hemoglobin: 13.6 g/dL (ref 13.0–17.0)
Potassium: 3.6 mmol/L (ref 3.5–5.1)
Sodium: 141 mmol/L (ref 135–145)
TCO2: 20 mmol/L — ABNORMAL LOW (ref 22–32)

## 2020-08-29 SURGERY — PERIPHERAL VASCULAR THROMBECTOMY
Anesthesia: LOCAL

## 2020-08-29 MED ORDER — HYDRALAZINE HCL 20 MG/ML IJ SOLN: 5.0000 mg | INTRAMUSCULAR | Status: DC | PRN

## 2020-08-29 MED ORDER — SODIUM CHLORIDE 0.9% FLUSH: 3.0000 mL | INTRAVENOUS | Status: DC | PRN

## 2020-08-29 MED ORDER — HEPARIN SODIUM (PORCINE) 1000 UNIT/ML IJ SOLN
INTRAMUSCULAR | Status: AC
  Filled 2020-08-29: qty 1

## 2020-08-29 MED ORDER — MIDAZOLAM HCL 2 MG/2ML IJ SOLN
INTRAMUSCULAR | Status: DC | PRN
  Administered 2020-08-29: 2 mg via INTRAVENOUS

## 2020-08-29 MED ORDER — SODIUM CHLORIDE 0.9% FLUSH: 3.0000 mL | Freq: Two times a day (BID) | INTRAVENOUS | Status: DC

## 2020-08-29 MED ORDER — ONDANSETRON HCL 4 MG/2ML IJ SOLN: 4.0000 mg | Freq: Four times a day (QID) | INTRAMUSCULAR | Status: DC | PRN

## 2020-08-29 MED ORDER — LIDOCAINE HCL (PF) 1 % IJ SOLN
INTRAMUSCULAR | Status: AC
  Filled 2020-08-29: qty 30

## 2020-08-29 MED ORDER — SODIUM CHLORIDE 0.9 % WEIGHT BASED INFUSION: 1.0000 mL/kg/h | INTRAVENOUS | Status: DC

## 2020-08-29 MED ORDER — IODIXANOL 320 MG/ML IV SOLN
INTRAVENOUS | Status: DC | PRN
  Administered 2020-08-29: 09:00:00 8 mL via INTRAVENOUS

## 2020-08-29 MED ORDER — SODIUM CHLORIDE 0.9 % IV SOLN: 250.0000 mL | INTRAVENOUS | Status: DC | PRN

## 2020-08-29 MED ORDER — HEPARIN (PORCINE) IN NACL 1000-0.9 UT/500ML-% IV SOLN
INTRAVENOUS | Status: DC | PRN
  Administered 2020-08-29: 500 mL

## 2020-08-29 MED ORDER — MIDAZOLAM HCL 2 MG/2ML IJ SOLN
INTRAMUSCULAR | Status: AC
  Filled 2020-08-29: qty 2

## 2020-08-29 MED ORDER — FENTANYL CITRATE (PF) 100 MCG/2ML IJ SOLN
INTRAMUSCULAR | Status: DC | PRN
  Administered 2020-08-29 (×2): 50 ug via INTRAVENOUS

## 2020-08-29 MED ORDER — FENTANYL CITRATE (PF) 100 MCG/2ML IJ SOLN
INTRAMUSCULAR | Status: AC
  Filled 2020-08-29: qty 2

## 2020-08-29 MED ORDER — ACETAMINOPHEN 325 MG PO TABS
650.0000 mg | ORAL_TABLET | ORAL | Status: DC | PRN
  Administered 2020-08-29: 650 mg via ORAL
  Filled 2020-08-29: qty 2

## 2020-08-29 MED ORDER — SODIUM CHLORIDE 0.9 % IV SOLN: INTRAVENOUS | Status: DC

## 2020-08-29 MED ORDER — HEPARIN SODIUM (PORCINE) 1000 UNIT/ML IJ SOLN
INTRAMUSCULAR | Status: DC | PRN
  Administered 2020-08-29: 5000 [IU] via INTRAVENOUS

## 2020-08-29 MED ORDER — LIDOCAINE HCL (PF) 1 % IJ SOLN
INTRAMUSCULAR | Status: DC | PRN
  Administered 2020-08-29: 5 mL via INTRADERMAL

## 2020-08-29 MED ORDER — HEPARIN (PORCINE) IN NACL 1000-0.9 UT/500ML-% IV SOLN
INTRAVENOUS | Status: AC
  Filled 2020-08-29: qty 500

## 2020-08-29 MED ORDER — LABETALOL HCL 5 MG/ML IV SOLN: 10.0000 mg | INTRAVENOUS | Status: DC | PRN

## 2020-08-29 SURGICAL SUPPLY — 14 items
BAG SNAP BAND KOVER 36X36 (MISCELLANEOUS) ×2 IMPLANT
CATH ANGIO 5F BER2 100CM (CATHETERS) ×2 IMPLANT
CATH INFINITI VERT 5FR 125CM (CATHETERS) ×2 IMPLANT
CATH RETRIEVER CLOT 16MMX105CM (CATHETERS) ×2 IMPLANT
CATH VISIONS PV .035 IVUS (CATHETERS) ×2 IMPLANT
COVER DOME SNAP 22 D (MISCELLANEOUS) ×2 IMPLANT
GLIDEWIRE ADV .035X260CM (WIRE) ×2 IMPLANT
KIT MICROPUNCTURE NIT STIFF (SHEATH) ×2 IMPLANT
PROTECTION STATION PRESSURIZED (MISCELLANEOUS) ×2
SHEATH CLOT RETRIEVER (SHEATH) ×2 IMPLANT
SHEATH PINNACLE 8F 10CM (SHEATH) ×2 IMPLANT
SHEATH PROBE COVER 6X72 (BAG) ×2 IMPLANT
STATION PROTECTION PRESSURIZED (MISCELLANEOUS) ×1 IMPLANT
WIRE BENTSON .035X145CM (WIRE) ×2 IMPLANT

## 2020-08-29 NOTE — Progress Notes (Signed)
Patient was given discharge instructions. He verbalized understanding. 
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
## 2020-08-29 NOTE — Discharge Instructions (Signed)
Continue Xarelto as previously prescribed.  Will arrange follow-up in 1 month in the vascular surgery office.  Please call our office with questions or concerns particularly if any issues with the access sites behind the left knee in the popliteal vein.     Femoral Site Care This sheet gives you information about how to care for yourself after your procedure. Your health care provider may also give you more specific instructions. If you have problems or questions, contact your health care provider. What can I expect after the procedure? After the procedure, it is common to have:  Bruising that usually fades within 1-2 weeks.  Tenderness at the site. Follow these instructions at home: Wound care  Follow instructions from your health care provider about how to take care of your insertion site. Make sure you: ? Wash your hands with soap and water before you change your bandage (dressing). If soap and water are not available, use hand sanitizer. ? Change your dressing as told by your health care provider. ? Leave stitches (sutures), skin glue, or adhesive strips in place. These skin closures may need to stay in place for 2 weeks or longer. If adhesive strip edges start to loosen and curl up, you may trim the loose edges. Do not remove adhesive strips completely unless your health care provider tells you to do that.  Do not take baths, swim, or use a hot tub until your health care provider approves.  You may shower 24-48 hours after the procedure or as told by your health care provider. ? Gently wash the site with plain soap and water. ? Pat the area dry with a clean towel. ? Do not rub the site. This may cause bleeding.  Do not apply powder or lotion to the site. Keep the site clean and dry.  Check your femoral site every day for signs of infection. Check for: ? Redness, swelling, or pain. ? Fluid or blood. ? Warmth. ? Pus or a bad smell. Activity  For the first 2-3 days after your  procedure, or as long as directed: ? Avoid climbing stairs as much as possible. ? Do not squat.  Do not lift anything that is heavier than 10 lb (4.5 kg), or the limit that you are told, until your health care provider says that it is safe.  Rest as directed. ? Avoid sitting for a long time without moving. Get up to take short walks every 1-2 hours.  Do not drive for 24 hours if you were given a medicine to help you relax (sedative). General instructions  Take over-the-counter and prescription medicines only as told by your health care provider.  Keep all follow-up visits as told by your health care provider. This is important. Contact a health care provider if you have:  A fever or chills.  You have redness, swelling, or pain around your insertion site. Get help right away if:  The catheter insertion area swells very fast.  You pass out.  You suddenly start to sweat or your skin gets clammy.  The catheter insertion area is bleeding, and the bleeding does not stop when you hold steady pressure on the area.  The area near or just beyond the catheter insertion site becomes pale, cool, tingly, or numb. These symptoms may represent a serious problem that is an emergency. Do not wait to see if the symptoms will go away. Get medical help right away. Call your local emergency services (911 in the [place] Do not drive yourself  to the hospital. Summary  After the procedure, it is common to have bruising that usually fades within 1-2 weeks.  Check your femoral site every day for signs of infection.  Do not lift anything that is heavier than 10 lb (4.5 kg), or the limit that you are told, until your health care provider says that it is safe. This information is not intended to replace advice given to you by your health care provider. Make sure you discuss any questions you have with your health care provider. Document Revised: 09/06/2017 Document Reviewed: 09/06/2017 Elsevier Patient  Education  2020 [employer].
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
## 2020-09-02 LAB — SURGICAL PATHOLOGY

## 2020-09-18 ENCOUNTER — Other Ambulatory Visit: Payer: Self-pay | Admitting: *Deleted

## 2020-09-18 DIAGNOSIS — I82422 Acute embolism and thrombosis of left iliac vein: Secondary | ICD-10-CM

## 2020-10-01 ENCOUNTER — Encounter: Payer: Self-pay | Admitting: Vascular Surgery

## 2020-10-01 ENCOUNTER — Other Ambulatory Visit: Payer: Self-pay

## 2020-10-01 ENCOUNTER — Ambulatory Visit (HOSPITAL_COMMUNITY)
Admission: RE | Admit: 2020-10-01 | Discharge: 2020-10-01 | Disposition: A | Discharge status: Home / Self Care | Payer: Managed Care, Other (non HMO) | Source: Ambulatory Visit | Attending: Vascular Surgery | Admitting: Vascular Surgery

## 2020-10-01 ENCOUNTER — Ambulatory Visit (INDEPENDENT_AMBULATORY_CARE_PROVIDER_SITE_OTHER): Payer: Managed Care, Other (non HMO) | Admitting: Vascular Surgery

## 2020-10-01 VITALS — BP 122/88 | HR 89 | Temp 98.2°F | Resp 16 | Ht 71.5 in | Wt 220.0 lb

## 2020-10-01 DIAGNOSIS — I82422 Acute embolism and thrombosis of left iliac vein: Secondary | ICD-10-CM

## 2020-10-01 MED ORDER — RIVAROXABAN 20 MG PO TABS: 20.0000 mg | ORAL_TABLET | Freq: Every day | ORAL | 5 refills | Status: DC
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
## 2020-10-02 ENCOUNTER — Other Ambulatory Visit: Payer: Self-pay

## 2020-10-02 DIAGNOSIS — I82422 Acute embolism and thrombosis of left iliac vein: Secondary | ICD-10-CM
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
## 2022-03-14 ENCOUNTER — Emergency Department (HOSPITAL_COMMUNITY)
Admission: EM | Admit: 2022-03-14 | Discharge: 2022-03-14 | Disposition: A | Discharge status: Home / Self Care | Payer: Managed Care, Other (non HMO) | Attending: Student | Admitting: Student

## 2022-03-14 ENCOUNTER — Encounter (HOSPITAL_COMMUNITY): Payer: Self-pay | Admitting: Emergency Medicine

## 2022-03-14 ENCOUNTER — Other Ambulatory Visit: Payer: Self-pay

## 2022-03-14 ENCOUNTER — Emergency Department (HOSPITAL_COMMUNITY): Payer: Managed Care, Other (non HMO)

## 2022-03-14 DIAGNOSIS — S0101XA Laceration without foreign body of scalp, initial encounter: Secondary | ICD-10-CM | POA: Diagnosis not present

## 2022-03-14 DIAGNOSIS — Z7901 Long term (current) use of anticoagulants: Secondary | ICD-10-CM | POA: Insufficient documentation

## 2022-03-14 DIAGNOSIS — Y908 Blood alcohol level of 240 mg/100 ml or more: Secondary | ICD-10-CM | POA: Insufficient documentation

## 2022-03-14 DIAGNOSIS — S0990XA Unspecified injury of head, initial encounter: Secondary | ICD-10-CM | POA: Diagnosis present

## 2022-03-14 DIAGNOSIS — W19XXXA Unspecified fall, initial encounter: Secondary | ICD-10-CM | POA: Diagnosis not present

## 2022-03-14 LAB — CBC WITH DIFFERENTIAL/PLATELET
Abs Immature Granulocytes: 0 10*3/uL (ref 0.00–0.07)
Basophils Absolute: 0 10*3/uL (ref 0.0–0.1)
Basophils Relative: 1 %
Eosinophils Absolute: 0.1 10*3/uL (ref 0.0–0.5)
Eosinophils Relative: 3 %
HCT: 38.6 % — ABNORMAL LOW (ref 39.0–52.0)
Hemoglobin: 14.3 g/dL (ref 13.0–17.0)
Immature Granulocytes: 0 %
Lymphocytes Relative: 41 %
Lymphs Abs: 1.5 10*3/uL (ref 0.7–4.0)
MCH: 35.6 pg — ABNORMAL HIGH (ref 26.0–34.0)
MCHC: 37 g/dL — ABNORMAL HIGH (ref 30.0–36.0)
MCV: 96 fL (ref 80.0–100.0)
Monocytes Absolute: 0.3 10*3/uL (ref 0.1–1.0)
Monocytes Relative: 8 %
Neutro Abs: 1.7 10*3/uL (ref 1.7–7.7)
Neutrophils Relative %: 47 %
Platelets: 191 10*3/uL (ref 150–400)
RBC: 4.02 MIL/uL — ABNORMAL LOW (ref 4.22–5.81)
RDW: 11.7 % (ref 11.5–15.5)
WBC: 3.6 10*3/uL — ABNORMAL LOW (ref 4.0–10.5)
nRBC: 0 % (ref 0.0–0.2)

## 2022-03-14 LAB — COMPREHENSIVE METABOLIC PANEL
ALT: 18 U/L (ref 0–44)
AST: 23 U/L (ref 15–41)
Albumin: 3.9 g/dL (ref 3.5–5.0)
Alkaline Phosphatase: 56 U/L (ref 38–126)
Anion gap: 11 (ref 5–15)
BUN: 6 mg/dL (ref 6–20)
CO2: 19 mmol/L — ABNORMAL LOW (ref 22–32)
Calcium: 8.1 mg/dL — ABNORMAL LOW (ref 8.9–10.3)
Chloride: 110 mmol/L (ref 98–111)
Creatinine, Ser: 0.75 mg/dL (ref 0.61–1.24)
GFR, Estimated: >60 mL/min (ref >60)
Glucose, Bld: 101 mg/dL — ABNORMAL HIGH (ref 70–99)
Potassium: 3.6 mmol/L (ref 3.5–5.1)
Sodium: 140 mmol/L (ref 135–145)
Total Bilirubin: 0.4 mg/dL (ref 0.3–1.2)
Total Protein: 6.3 g/dL — ABNORMAL LOW (ref 6.5–8.1)

## 2022-03-14 LAB — ETHANOL: Alcohol, Ethyl (B): 363 mg/dL (ref <10)

## 2022-03-14 MED ORDER — LACTATED RINGERS IV BOLUS
1000.0000 mL | Freq: Once | INTRAVENOUS | Status: AC
  Administered 2022-03-14: 1000 mL via INTRAVENOUS

## 2022-03-14 NOTE — ED Notes (Signed)
Pt states "I have just been drinking a little too much and I fell". Pt reports drinking daily.
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
## 2022-03-14 NOTE — ED Notes (Signed)
Pt disconnected self from monitor and ambulated to bathroom.

## 2022-03-14 NOTE — ED Notes (Signed)
Patient verbalizes understanding of discharge instructions. Opportunity for questioning and answers were provided. Armband removed by staff, pt discharged from ED. Pt states that ride is on the way. Pt taken to ED waiting room via wheelchair.

## 2022-03-14 NOTE — ED Notes (Signed)
Patient transported to CT 

## 2022-03-14 NOTE — ED Notes (Signed)
MD Kommor made aware of alcohol 363.
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
## 2022-03-16 ENCOUNTER — Encounter (INDEPENDENT_AMBULATORY_CARE_PROVIDER_SITE_OTHER): Payer: Self-pay | Admitting: Primary Care

## 2022-03-16 ENCOUNTER — Ambulatory Visit (INDEPENDENT_AMBULATORY_CARE_PROVIDER_SITE_OTHER): Payer: Self-pay | Admitting: *Deleted

## 2022-03-16 ENCOUNTER — Telehealth (INDEPENDENT_AMBULATORY_CARE_PROVIDER_SITE_OTHER): Payer: Self-pay | Admitting: Primary Care

## 2022-03-16 NOTE — Telephone Encounter (Signed)
Copied from CRM [id number]. Topic: Appointment Scheduling - Scheduling Inquiry for Clinic >> Mar 16, 2022 11:24 AM Troy Klein wrote: Reason for CRM: The patient would like to be seen for a hospital follow up. There were no available appointments within the required three days of discharge at the time of call with patient. Please contact further to schedule the patient successfully.

## 2022-03-16 NOTE — Telephone Encounter (Signed)
LVM asking patient to return call to office

## 2022-03-16 NOTE — Telephone Encounter (Signed)
Left message asking patient to return call to office

## 2022-03-16 NOTE — Telephone Encounter (Signed)
  Chief Complaint: Fall Symptoms: Pt fell Saturday, seen in ED, scans neg.Scalp laceration with 3 staples. Calling for hostiple F/U appt. Reports "Still off balance" Does not have to hold onto anything to get around,denies dizziness, "Just kind of whoozy" Mild headache Frequency: SAt Pertinent Negatives: Patient denies dizziness, disorientation, weakness Disposition: [] ED /[] Urgent Care (no appt availability in office) / [] Appointment(In office/virtual)/ []  [place] [facility]/ [] Home Care/ [] Refused Recommended Disposition /[] [facility]/ [x]  Follow-up with PCP Additional Notes: Agent sent CRM for F/U appt. Pt requesting work note, states has , is calling IT for assistance for PW. Will come to pick up if needed. Advised ED for worsening symptoms. CAre advice provided, verbalizes understanding.  Please advise Reason for Disposition  Scalp swelling, bruise or pain  Answer Assessment - Initial Assessment Questions 1. MECHANISM: "How did the injury happen?" For falls, ask: "What height did you fall from?" and "What surface did you fall against?"      Fell, blacked out 2. ONSET: "When did the injury happen?" (Minutes or hours ago)      Saturday 3. NEUROLOGIC SYMPTOMS: "Was there any loss of consciousness?" "Are there any other neurological symptoms?"      Yes. Unsure how long 4. MENTAL STATUS: "Does the person know who they are, who you are, and where they are?"      yes 5. LOCATION: "What part of the head was hit?"      Seen in ED 6. SCALP APPEARANCE: "What does the scalp look like? Is it bleeding now?" If Yes, ask: "Is it difficult to stop?"      Laceration on scalp  3 stables 7. SIZE: For cuts, bruises, or swelling, ask: "How large is it?" (e.g., inches or centimeters)       8. PAIN: "Is there any pain?" If Yes, ask: "How bad is it?"  (e.g., Scale 1-10; or mild, moderate, severe)      9. TETANUS: For any breaks in the skin, ask: "When was the last tetanus booster?"       10. OTHER SYMPTOMS: "Do you have any other symptoms?" (e.g., neck pain, vomiting)       Off balance, mild headache  Protocols used: Head Injury-A-AH

## 2022-03-17 NOTE — Telephone Encounter (Signed)
Patient offered an appointment; he declined. He is aware that our office is not able to provide him with a work note as we have not seen him.

## 2023-04-21 ENCOUNTER — Encounter: Payer: Self-pay | Admitting: Gastroenterology

## 2023-07-07 ENCOUNTER — Encounter: Payer: Self-pay | Admitting: Gastroenterology

## 2023-07-07 ENCOUNTER — Ambulatory Visit (INDEPENDENT_AMBULATORY_CARE_PROVIDER_SITE_OTHER): Payer: Managed Care, Other (non HMO) | Admitting: Gastroenterology

## 2023-07-07 VITALS — BP 114/82 | HR 77 | Ht 69.69 in | Wt 235.2 lb

## 2023-07-07 DIAGNOSIS — Z1211 Encounter for screening for malignant neoplasm of colon: Secondary | ICD-10-CM | POA: Insufficient documentation

## 2023-07-07 MED ORDER — NA SULFATE-K SULFATE-MG SULF 17.5-3.13-1.6 GM/177ML PO SOLN: 1.0000 | Freq: Once | ORAL | 0 refills | Status: AC
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
## 2023-07-07 NOTE — Patient Instructions (Addendum)
You have been scheduled for a colonoscopy. Please follow written instructions given to you at your visit today.   Please pick up your prep supplies at the pharmacy within the next 1-3 days.  If you use inhalers (even only as needed), please bring them with you on the day of your procedure.  DO NOT TAKE 7 DAYS PRIOR TO TEST- Trulicity (dulaglutide) Ozempic, Wegovy (semaglutide) Mounjaro (tirzepatide) Bydureon Bcise (exanatide extended release)  DO NOT TAKE 1 DAY PRIOR TO YOUR TEST Rybelsus (semaglutide) Adlyxin (lixisenatide) Victoza (liraglutide) Byetta (exanatide) ___________________________________________________________________________  _______________________________________________________  If your blood pressure at your visit was 140/90 or greater, please contact your primary care physician to follow up on this.  _______________________________________________________  If you are age 39 or older, your body mass index should be between 23-30. Your Body mass index is 34.06 kg/m. If this is out of the aforementioned range listed, please consider follow up with your Primary Care Provider.  If you are age 43 or younger, your body mass index should be between 19-25. Your Body mass index is 34.06 kg/m. If this is out of the aformentioned range listed, please consider follow up with your Primary Care Provider.   ________________________________________________________  The [facility] providers would like to encourage you to use [facility] to communicate with providers for non-urgent requests or questions.  Due to long hold times on the telephone, sending your provider a message by [facility] may be a faster and more efficient way to get a response.  Please allow 48 business hours for a response.  Please remember that this is for non-urgent requests.  _______________________________________________________

## 2023-07-15 NOTE — Progress Notes (Signed)
Agree with the assessment and plan as outlined by Jessica Zehr, PA-C.  Saulo Anthis E. Darcel Frane, MD  [facility]  

## 2023-08-09 ENCOUNTER — Encounter: Payer: Self-pay | Admitting: Gastroenterology

## 2023-09-10 ENCOUNTER — Encounter: Payer: Managed Care, Other (non HMO) | Admitting: Gastroenterology

## 2023-10-25 ENCOUNTER — Encounter: Payer: Managed Care, Other (non HMO) | Admitting: Gastroenterology
